# Patient Record
Sex: Female | Born: 1955 | Race: White | Hispanic: No | Marital: Married | Smoking: Former smoker
Health system: Southern US, Community
[De-identification: ages and names within clinical notes are randomized; demographics above are authoritative.]

## PROBLEM LIST (undated history)

## (undated) DIAGNOSIS — K219 Gastro-esophageal reflux disease without esophagitis: Secondary | ICD-10-CM

## (undated) DIAGNOSIS — K635 Polyp of colon: Secondary | ICD-10-CM

## (undated) DIAGNOSIS — E785 Hyperlipidemia, unspecified: Secondary | ICD-10-CM

## (undated) HISTORY — DX: Hyperlipidemia, unspecified: E78.5

## (undated) HISTORY — PX: COLONOSCOPY: SHX5424

## (undated) HISTORY — DX: Polyp of colon: K63.5

## (undated) HISTORY — PX: BREAST BIOPSY: SHX20

## (undated) HISTORY — PX: CHOLECYSTECTOMY: SHX55

## (undated) HISTORY — DX: Gastro-esophageal reflux disease without esophagitis: K21.9

---

## 1984-04-26 HISTORY — PX: ABDOMINAL HYSTERECTOMY: SHX81

## 1984-04-26 HISTORY — PX: APPENDECTOMY: SHX54

## 2008-01-30 ENCOUNTER — Ambulatory Visit: Payer: Self-pay | Admitting: Internal Medicine

## 2008-06-19 ENCOUNTER — Encounter: Admission: RE | Admit: 2008-06-19 | Discharge: 2008-06-19 | Payer: Self-pay | Admitting: Family Medicine

## 2008-11-13 ENCOUNTER — Encounter (INDEPENDENT_AMBULATORY_CARE_PROVIDER_SITE_OTHER): Payer: Self-pay | Admitting: General Surgery

## 2008-11-13 ENCOUNTER — Ambulatory Visit (HOSPITAL_COMMUNITY): Admission: RE | Admit: 2008-11-13 | Discharge: 2008-11-13 | Payer: Self-pay | Admitting: General Surgery

## 2009-11-10 ENCOUNTER — Encounter: Admission: RE | Admit: 2009-11-10 | Discharge: 2009-11-10 | Payer: Self-pay | Admitting: Family Medicine

## 2010-05-17 ENCOUNTER — Encounter: Payer: Self-pay | Admitting: Family Medicine

## 2010-07-02 ENCOUNTER — Ambulatory Visit (HOSPITAL_COMMUNITY): Payer: Self-pay

## 2010-08-02 LAB — COMPREHENSIVE METABOLIC PANEL
ALT: 18 U/L (ref 0–35)
AST: 22 U/L (ref 0–37)
Albumin: 3.9 g/dL (ref 3.5–5.2)
Alkaline Phosphatase: 51 U/L (ref 39–117)
Potassium: 3.3 mEq/L — ABNORMAL LOW (ref 3.5–5.1)
Sodium: 144 mEq/L (ref 135–145)
Total Protein: 6.5 g/dL (ref 6.0–8.3)

## 2010-08-02 LAB — DIFFERENTIAL
Basophils Relative: 1 % (ref 0–1)
Eosinophils Absolute: 0.1 10*3/uL (ref 0.0–0.7)
Monocytes Absolute: 0.5 10*3/uL (ref 0.1–1.0)
Monocytes Relative: 7 % (ref 3–12)

## 2010-08-02 LAB — CBC
Platelets: 283 10*3/uL (ref 150–400)
RDW: 13 % (ref 11.5–15.5)

## 2010-09-08 NOTE — Op Note (Signed)
Lindsey Morales, Lindsey Morales           ACCOUNT NO.:  [id number]   MEDICAL RECORD NO.:  [id number]          PATIENT TYPE:  AMB   LOCATION:  DAY                          FACILITY:  [facility]   PHYSICIAN:  Lennie Muckle, MD      DATE OF BIRTH:  06/19/1955   DATE OF PROCEDURE:  11/13/2008  DATE OF DISCHARGE:                               OPERATIVE REPORT   PREOPERATIVE DIAGNOSIS:  Biliary colic.   POSTOPERATIVE DIAGNOSIS:  Biliary colic.   PROCEDURE:  Laparoscopic cholecystectomy.   SURGEON:  Bertram Savin, M.D.  No assistants.   General endotracheal anesthesia.   FINDINGS:  Small stones in the gallbladder.  No other significant  findings.   SPECIMEN:  Gallbladder.   Minimal amount of blood loss.  No immediate complications.  No drains  were placed.   INDICATIONS FOR PROCEDURE:  Lindsey Morales was identified in the  preoperative holding area.  She was a 55 year old female who had had  epigastric right upper quadrant pain for half a year.  She had had  endoscopy which was normal.  CT did reveal a stone within the  gallbladder.  There was no other significant findings on her workup  aside from abdominal bloating and mild regurgitation.  I talked to her  about doing a laparoscopic cholecystectomy since it was found that her  symptoms were related to biliary colic.  An informed consent was  obtained prior to procedure.   DESCRIPTION OF PROCEDURE:  Lindsey Morales was identified in the  preoperative holding area.  She had no change in her examination.  She  received a g of Kefzol and was taken to the operating room.  She was  then placed under general endotracheal anesthesia.  Her abdomen was  prepped and draped in the usual sterile fashion.  I placed an incision  in the right upper quadrant and using a 5-mm trocar and the OptiView  gained entry into the abdominal cavity.  All layers of abdominal wall  were visualized upon entry.  After insufflating the abdomen, I carefully  inspected.  I found  no evidence of injury upon placement of the trocar.  I then went down towards the umbilical region.  Previous incisions had  no adhesional scars.  I then closed with an 11-mm trocar at the  epigastric region under visualization with camera.  I then placed 2  additional trocars in the right upper quadrant.  The gallbladder was  identified in normal anatomic position.  The fundus was retracted to the  head of the patient.  We protected the infundibulum away from the liver  bed.  The cystic artery was anterior to the cystic duct.  I was able to  clip and divide this in order to facilitate the dissection the cystic  duct.  Then, using electrocautery and [place] forceps, I gained  critical view of the cystic duct and the liver bed.  I could palpate no  stones near the cystic duct.  I was able to place three 5-mm clips  proximally and one distally and transected the cystic duct.  There was a  posterior branch of the  cystic artery which I also clipped and divided  without difficulty.  The remaining peritoneal attachments were divided  with electrocautery.  I placed a Ray-Tec in the abdominal cavity and  used this to catch the minimal blood.  Also used this to visualize no  evidence of a biliary leak.  The gallbladder was placed an EndoCatch bag  and removed from the umbilical port.  I then removed the Ray-Tec.  I  found no bleeding from the liver bed.  I then closed the fascial defect  at the umbilical region using a 0 Vicryl suture with the suture passer.  The fascia was closed without difficulty.  Final inspection of the  abdomen revealed no bleeding and no evidence of injury.  I released the  pneumoperitoneum, removed the trocars, closed the skin with 4-0  Monocryl.  Dermabond was placed as a final dressing.  The patient was  then extubated and transported to anesthesia care in stable condition.  She will be discharged home.  She is able to tolerate liquids and have  her pain adequately  controlled.      Lennie Muckle, MD  Electronically Signed     ALA/MEDQ  D:  11/13/2008  T:  11/13/2008  Job:  [id number]   cc:   Stormy Card, M.D.  [phone]   Anselmo Rod, M.D.  Fax: [phone]

## 2010-11-24 ENCOUNTER — Other Ambulatory Visit: Payer: Self-pay | Admitting: Family Medicine

## 2010-11-24 DIAGNOSIS — Z1231 Encounter for screening mammogram for malignant neoplasm of breast: Secondary | ICD-10-CM

## 2010-12-01 ENCOUNTER — Ambulatory Visit: Payer: Self-pay

## 2010-12-15 ENCOUNTER — Ambulatory Visit
Admission: RE | Admit: 2010-12-15 | Discharge: 2010-12-15 | Disposition: A | Discharge status: Home / Self Care | Payer: Self-pay | Source: Ambulatory Visit | Attending: Family Medicine | Admitting: Family Medicine

## 2010-12-15 DIAGNOSIS — Z1231 Encounter for screening mammogram for malignant neoplasm of breast: Secondary | ICD-10-CM

## 2011-12-02 ENCOUNTER — Other Ambulatory Visit: Payer: Self-pay | Admitting: Family Medicine

## 2011-12-02 DIAGNOSIS — Z139 Encounter for screening, unspecified: Secondary | ICD-10-CM

## 2011-12-14 ENCOUNTER — Ambulatory Visit (INDEPENDENT_AMBULATORY_CARE_PROVIDER_SITE_OTHER): Payer: Managed Care, Other (non HMO)

## 2011-12-14 DIAGNOSIS — Z1231 Encounter for screening mammogram for malignant neoplasm of breast: Secondary | ICD-10-CM

## 2011-12-14 DIAGNOSIS — Z139 Encounter for screening, unspecified: Secondary | ICD-10-CM

## 2011-12-20 ENCOUNTER — Ambulatory Visit (INDEPENDENT_AMBULATORY_CARE_PROVIDER_SITE_OTHER): Payer: Managed Care, Other (non HMO)

## 2011-12-20 ENCOUNTER — Ambulatory Visit (INDEPENDENT_AMBULATORY_CARE_PROVIDER_SITE_OTHER): Payer: Managed Care, Other (non HMO) | Admitting: Physician Assistant

## 2011-12-20 ENCOUNTER — Encounter: Payer: Self-pay | Admitting: Physician Assistant

## 2011-12-20 VITALS — BP 133/88 | HR 83 | Ht 66.0 in | Wt 177.0 lb

## 2011-12-20 DIAGNOSIS — R209 Unspecified disturbances of skin sensation: Secondary | ICD-10-CM

## 2011-12-20 DIAGNOSIS — K219 Gastro-esophageal reflux disease without esophagitis: Secondary | ICD-10-CM | POA: Insufficient documentation

## 2011-12-20 DIAGNOSIS — M549 Dorsalgia, unspecified: Secondary | ICD-10-CM

## 2011-12-20 DIAGNOSIS — M47814 Spondylosis without myelopathy or radiculopathy, thoracic region: Secondary | ICD-10-CM

## 2011-12-20 DIAGNOSIS — F419 Anxiety disorder, unspecified: Secondary | ICD-10-CM

## 2011-12-20 DIAGNOSIS — M47817 Spondylosis without myelopathy or radiculopathy, lumbosacral region: Secondary | ICD-10-CM

## 2011-12-20 DIAGNOSIS — R202 Paresthesia of skin: Secondary | ICD-10-CM

## 2011-12-20 DIAGNOSIS — R030 Elevated blood-pressure reading, without diagnosis of hypertension: Secondary | ICD-10-CM

## 2011-12-20 DIAGNOSIS — F411 Generalized anxiety disorder: Secondary | ICD-10-CM

## 2011-12-20 DIAGNOSIS — R200 Anesthesia of skin: Secondary | ICD-10-CM

## 2011-12-20 DIAGNOSIS — E785 Hyperlipidemia, unspecified: Secondary | ICD-10-CM

## 2011-12-20 DIAGNOSIS — L659 Nonscarring hair loss, unspecified: Secondary | ICD-10-CM

## 2011-12-20 NOTE — Patient Instructions (Addendum)
Recommend ASA to take with food.   DASH Diet The DASH diet stands for "Dietary Approaches to Stop Hypertension." It is a healthy eating plan that has been shown to reduce high blood pressure (hypertension) in as little as 14 days, while also possibly providing other significant health benefits. These other health benefits include reducing the risk of breast cancer after menopause and reducing the risk of type 2 diabetes, heart disease, colon cancer, and stroke. Health benefits also include weight loss and slowing kidney failure in patients with chronic kidney disease.  DIET GUIDELINES  Limit salt (sodium). Your diet should contain less than 1500 mg of sodium daily.   Limit refined or processed carbohydrates. Your diet should include mostly whole grains. Desserts and added sugars should be used sparingly.   Include small amounts of heart-healthy fats. These types of fats include nuts, oils, and tub margarine. Limit saturated and trans fats. These fats have been shown to be harmful in the body.  CHOOSING FOODS  The following food groups are based on a 2000 calorie diet. See your Registered Dietitian for individual calorie needs. Grains and Grain Products (6 to 8 servings daily)  Eat More Often: Whole-wheat bread, brown rice, whole-grain or wheat pasta, quinoa, popcorn without added fat or salt (air popped).   Eat Less Often: White bread, white pasta, white rice, cornbread.  Vegetables (4 to 5 servings daily)  Eat More Often: Fresh, frozen, and canned vegetables. Vegetables may be raw, steamed, roasted, or grilled with a minimal amount of fat.   Eat Less Often/Avoid: Creamed or fried vegetables. Vegetables in a cheese sauce.  Fruit (4 to 5 servings daily)  Eat More Often: All fresh, canned (in natural juice), or frozen fruits. Dried fruits without added sugar. One hundred percent fruit juice ( cup [237 mL] daily).   Eat Less Often: Dried fruits with added sugar. Canned fruit in light or  heavy syrup.  Foot Locker, Fish, and Poultry (2 servings or less daily. One serving is 3 to 4 oz [85-114 g]).  Eat More Often: Ninety percent or leaner ground beef, tenderloin, sirloin. Round cuts of beef, chicken breast, [place] breast. All fish. Grill, bake, or broil your meat. Nothing should be fried.   Eat Less Often/Avoid: Fatty cuts of meat, [place], or chicken leg, thigh, or wing. Fried cuts of meat or fish.  Dairy (2 to 3 servings)  Eat More Often: Low-fat or fat-free milk, low-fat plain or light yogurt, reduced-fat or part-skim cheese.   Eat Less Often/Avoid: Milk (whole, 2%, skim, or chocolate).Whole milk yogurt. Full-fat cheeses.  Nuts, Seeds, and Legumes (4 to 5 servings per week)  Eat More Often: All without added salt.   Eat Less Often/Avoid: Salted nuts and seeds, canned beans with added salt.  Fats and Sweets (limited)  Eat More Often: Vegetable oils, tub margarines without trans fats, sugar-free gelatin. Mayonnaise and salad dressings.   Eat Less Often/Avoid: Coconut oils, palm oils, butter, stick margarine, cream, half and half, cookies, candy, pie.  FOR MORE INFORMATION The Dash Diet Eating Plan: www.dashdiet.org Document Released: 04/01/2011 Document Reviewed: 03/22/2011 [employer] Patient Information 2012 [employer].1.5 Gram Low Sodium Diet A 1.5 gram sodium diet restricts the amount of sodium in the diet to no more than 1.5 g or 1500 mg daily. The American Heart Association recommends Americans over the age of 83 to consume no more than 1500 mg of sodium each day to reduce the risk of developing high blood pressure. Research also shows that limiting  sodium may reduce heart attack and stroke risk. Many foods contain sodium for flavor and sometimes as a preservative. When the amount of sodium in a diet needs to be low, it is important to know what to look for when choosing foods and drinks. The following includes some information and guidelines to help make it easier for  you to adapt to a low sodium diet. QUICK TIPS  Do not add salt to food.   Avoid convenience items and fast food.   Choose unsalted snack foods.   Buy lower sodium products, often labeled as "lower sodium" or "no salt added."   Check food labels to learn how much sodium is in 1 serving.   When eating at a restaurant, ask that your food be prepared with less salt or none, if possible.  READING FOOD LABELS FOR SODIUM INFORMATION The nutrition facts label is a good place to find how much sodium is in foods. Look for products with no more than 400 mg of sodium per serving. Remember that 1.5 g = 1500 mg. The food label may also list foods as:  Sodium-free: Less than 5 mg in a serving.   Very low sodium: 35 mg or less in a serving.   Low-sodium: 140 mg or less in a serving.   Light in sodium: 50% less sodium in a serving. For example, if a food that usually has 300 mg of sodium is changed to become light in sodium, it will have 150 mg of sodium.   Reduced sodium: 25% less sodium in a serving. For example, if a food that usually has 400 mg of sodium is changed to reduced sodium, it will have 300 mg of sodium.  CHOOSING FOODS Grains  Avoid: Salted crackers and snack items. Some cereals, including instant hot cereals. Bread stuffing and biscuit mixes. Seasoned rice or pasta mixes.   Choose: Unsalted snack items. Low-sodium cereals, oats, puffed wheat and rice, shredded wheat. English muffins and bread. Pasta.  Meats  Avoid: Salted, canned, smoked, spiced, pickled meats, including fish and poultry. Bacon, ham, sausage, cold cuts, hot dogs, anchovies.   Choose: Low-sodium canned tuna and salmon. Fresh or frozen meat, poultry, and fish.  Dairy  Avoid: Processed cheese and spreads. Cottage cheese. Buttermilk and condensed milk. Regular cheese.   Choose: Milk. Low-sodium cottage cheese. Yogurt. Sour cream. Low-sodium cheese.  Fruits and Vegetables  Avoid: Regular canned vegetables.  Regular canned tomato sauce and paste. Frozen vegetables in sauces. Olives. Rosita Fire. Relishes. Sauerkraut.   Choose: Low-sodium canned vegetables. Low-sodium tomato sauce and paste. Frozen or fresh vegetables. Fresh and frozen fruit.  Condiments  Avoid: Canned and packaged gravies. Worcestershire sauce. Tartar sauce. Barbecue sauce. Soy sauce. Steak sauce. Ketchup. Onion, garlic, and table salt. Meat flavorings and tenderizers.   Choose: Fresh and dried herbs and spices. Low-sodium varieties of mustard and ketchup. Lemon juice. Tabasco sauce. Horseradish.  SAMPLE 1.5 GRAM SODIUM MEAL PLAN Breakfast / Sodium (mg)  1 cup low-fat milk / 143 mg   1 whole-wheat English muffin / 240 mg   1 tbs heart-healthy margarine / 153 mg   1 hard-boiled egg / 139 mg   1 small orange / 0 mg  Lunch / Sodium (mg)  1 cup raw carrots / 76 mg   2 tbs no salt added peanut butter / 5 mg   2 slices whole-wheat bread / 270 mg   1 tbs jelly / 6 mg    cup red grapes / 2 mg  Dinner / Sodium (mg)  1 cup whole-wheat pasta / 2 mg   1 cup low-sodium tomato sauce / 73 mg   3 oz lean ground beef / 57 mg   1 small side salad (1 cup raw spinach leaves,  cup cucumber,  cup yellow bell pepper) with 1 tsp olive oil and 1 tsp red wine vinegar / 25 mg  Snack / Sodium (mg)  1 container low-fat vanilla yogurt / 107 mg   3 graham cracker squares / 127 mg  Nutrient Analysis  Calories: 1745   Protein: 75 g   Carbohydrate: 237 g   Fat: 57 g   Sodium: 1425 mg  Document Released: 04/12/2005 Document Revised: 04/01/2011 Document Reviewed: 07/14/2009 [facility] Patient Information 2012 [place], [place].

## 2011-12-20 NOTE — Progress Notes (Signed)
Subjective:    Patient ID: Lindsey Morales, female    DOB: [birth year], 56 y.o.   MRN: [id number]  HPI Patient presents to the clinic today to establish care as a new patient. Past medical history is positive for hyperlipidemia which he has controlled with diet and exercise thus far and GERD. She is on omeprazole 40 mg twice a week approximately to control symptoms. She has had H. pylori infection.  She is a former smoker who stopped smoking in December of 2008. She did smoke for a total of 8 years.   Her family history is positive for breast cancer with her sister hyperlipidemia, hypertension, myocardial infarction, and diabetes. She reports to have had regular complete physical.   Her last mammogram was 12/14/11 and she does not have the results back yet. She is up-to-date on her colonoscopy which was normal and in March 2011 51 her to followup in 5 years with another colonoscopy. She does regularly exercise in the form of walking 30 minutes at least 5-6 days a week.  She does report ongoing back pain in the lumbar and thoracic region that has been present for 2 months. She has never had any imaging done on her back. She does have a history of being kicked by a horse in 1991. She incurred no significant injuries from that encounter. She does not notice any recent injuries that could be affecting her back pain. She has had some numbness and tingling but only down her right side in her right hand and right leg and feet. She denies any loss of bowel or bladder function. She has occasional radiation of pain into her buttocks but does not radiate down her whole leg. She has not lost any of her daily function. Her muscles are also tender in her trunk region. She reports that she feels sore in her upper and mid back area.  Her blood pressure was elevated in triage. She reports that this is not something that she has had problems within the past. She denies any chest pains, palpitations, shortness of  breath, headaches, dizziness. She does have a family history of hypertension but has never been on pain medications herself. She denies any swelling of her lower extremity.  Patient does report that she feels more anxious lately like her insides are just fidgety. She has had problems with anxiety in the past but never had take medication. She denies any heat or cold intolerances other than her occasional hot flashes. She does so like her hair is thinning. She did not feel like her anxiety is controlling her life. She reports that she sleeps well at night. She denies any depression or feeling of hopelessness or helplessness. She has had a lot of situational things that are increasing her anxiety such as breaking pounds with a really good friend. She thinks things will be much better when she gets through this breakup.   Review of Systems     Objective:   Physical Exam  Constitutional: She is oriented to person, place, and time. She appears well-developed and well-nourished.       Overweight  HENT:  Head: Normocephalic and atraumatic.  Cardiovascular: Normal rate, regular rhythm and normal heart sounds.   Pulmonary/Chest: Effort normal and breath sounds normal.  Musculoskeletal:       Range of motion at the waist is full and normal without pain. Palpation along the lumbar spine around L2 and L3 is painful. No other pain elicited along the spine. There is paraspinous  tenderness in the upper shoulders and along the thoracic spine area. Strength of right arm 5 out of 5 along with strength of right leg is 5 out of 5. Patient walks with normal gait. Negative straight leg test of right leg. Reflex of right knee is 1+.  Neurological: She is alert and oriented to person, place, and time.  Skin: Skin is warm and dry.  Psychiatric: She has a normal mood and affect. Her behavior is normal.          Assessment & Plan:  Elevated blood pressure reading-I rechecked her blood pressure at the end of the  visit and decreased to 133/88. I did still give her counseling on a low sodium diet. I also gave her handout regarding the DASH diet. Talked about how exercise and weight loss can also help decrease blood pressure. Will recheck blood pressure at complete physical in one month. Discussed with patient how important it is to control blood pressure to keep overall body/kidney healthy.  Lower back pain/thoracic pain numbness and tingling of right leg and foot along with right arm-will get lumbar and thoracic x-rays today and call with results. Also will get a thyroid and a B12 just to make sure that there is no metabolic causes of numbness and tingling. She does have a history of diabetes did not get a fasting glucose today because patient had eaten she reports that she will come back in a month complete physical. She does not like to take a lot of pain medications therefore she has not tried Tylenol. She can not take NSAIDs do to her GERD. I did encourage her to take 500 mg of Tylenol when needed for pain. I also encouraged her to keep walking and potentially add in strength exercises to help strengthen the muscles around the bone.  Anxiety/hair loss-already ordering a TSH. I discussed with patient if inside he starts to control her life or be coming in every day think that she can call as soon as things we can do to help with anxiety. I encouraged her to exercise regularly because that naturally helps to decrease anxiety. I encouraged her to make sure she is taking a multivitamin for with the B. vitamins to help maintain good hair growth. We will call with any lab results.  With patient's extensive aspirin daily. If this begins to worsen her GERD or have any side effects she can stop the however I want to see if taken with food this affects her stomach at all. She has no history of GI bleed.  Tdap given today.   Patient was reminded that she needed CPE in next month or so. Patient was informed to come  fasting so that we could check cholesterol.

## 2011-12-22 ENCOUNTER — Telehealth: Payer: Self-pay | Admitting: Family Medicine

## 2011-12-22 NOTE — Telephone Encounter (Signed)
Pt called request to know if her X-ray she had done on Monday 12/20/11 can be filed as in office visit per her insurance company instead of outpatient. Patient states that her insurance company states she will have to pay $500.00 deductible if filed as outpatient. Thanks

## 2011-12-22 NOTE — Telephone Encounter (Signed)
It can't be billed as part of the office visit as far as I am aware because the imaging Department does her own billing. We could see if maybe Molli Hazard knows.

## 2012-01-28 ENCOUNTER — Encounter: Payer: Managed Care, Other (non HMO) | Admitting: Physician Assistant

## 2012-11-06 ENCOUNTER — Other Ambulatory Visit: Payer: Self-pay | Admitting: Family Medicine

## 2012-11-06 DIAGNOSIS — Z1231 Encounter for screening mammogram for malignant neoplasm of breast: Secondary | ICD-10-CM

## 2012-12-14 ENCOUNTER — Ambulatory Visit: Payer: Managed Care, Other (non HMO)

## 2012-12-19 ENCOUNTER — Ambulatory Visit (INDEPENDENT_AMBULATORY_CARE_PROVIDER_SITE_OTHER): Payer: Managed Care, Other (non HMO)

## 2012-12-19 DIAGNOSIS — Z1231 Encounter for screening mammogram for malignant neoplasm of breast: Secondary | ICD-10-CM

## 2012-12-21 ENCOUNTER — Ambulatory Visit: Payer: Managed Care, Other (non HMO)

## 2013-11-01 ENCOUNTER — Other Ambulatory Visit: Payer: Self-pay | Admitting: Family Medicine

## 2013-11-01 DIAGNOSIS — Z139 Encounter for screening, unspecified: Secondary | ICD-10-CM

## 2013-12-11 ENCOUNTER — Ambulatory Visit (INDEPENDENT_AMBULATORY_CARE_PROVIDER_SITE_OTHER): Payer: Managed Care, Other (non HMO)

## 2013-12-11 DIAGNOSIS — Z139 Encounter for screening, unspecified: Secondary | ICD-10-CM

## 2013-12-11 DIAGNOSIS — Z1231 Encounter for screening mammogram for malignant neoplasm of breast: Secondary | ICD-10-CM

## 2014-11-01 ENCOUNTER — Other Ambulatory Visit: Payer: Self-pay | Admitting: Family Medicine

## 2014-11-01 DIAGNOSIS — Z1231 Encounter for screening mammogram for malignant neoplasm of breast: Secondary | ICD-10-CM

## 2014-12-19 ENCOUNTER — Ambulatory Visit (INDEPENDENT_AMBULATORY_CARE_PROVIDER_SITE_OTHER): Payer: Managed Care, Other (non HMO)

## 2014-12-19 DIAGNOSIS — Z1231 Encounter for screening mammogram for malignant neoplasm of breast: Secondary | ICD-10-CM | POA: Diagnosis not present

## 2015-10-01 ENCOUNTER — Other Ambulatory Visit: Payer: Self-pay | Admitting: Family Medicine

## 2015-10-01 DIAGNOSIS — Z1231 Encounter for screening mammogram for malignant neoplasm of breast: Secondary | ICD-10-CM

## 2016-01-12 ENCOUNTER — Ambulatory Visit
Admission: RE | Admit: 2016-01-12 | Discharge: 2016-01-12 | Disposition: A | Discharge status: Home / Self Care | Payer: No Typology Code available for payment source | Source: Ambulatory Visit | Attending: Family Medicine | Admitting: Family Medicine

## 2016-01-12 DIAGNOSIS — Z1231 Encounter for screening mammogram for malignant neoplasm of breast: Secondary | ICD-10-CM

## 2016-11-09 ENCOUNTER — Other Ambulatory Visit: Payer: Self-pay | Admitting: Family Medicine

## 2016-11-09 DIAGNOSIS — Z1231 Encounter for screening mammogram for malignant neoplasm of breast: Secondary | ICD-10-CM

## 2017-01-14 ENCOUNTER — Ambulatory Visit: Payer: No Typology Code available for payment source

## 2017-01-14 ENCOUNTER — Ambulatory Visit (INDEPENDENT_AMBULATORY_CARE_PROVIDER_SITE_OTHER): Payer: Managed Care, Other (non HMO)

## 2017-01-14 DIAGNOSIS — Z1231 Encounter for screening mammogram for malignant neoplasm of breast: Secondary | ICD-10-CM | POA: Diagnosis not present

## 2017-12-06 ENCOUNTER — Other Ambulatory Visit: Payer: Self-pay | Admitting: Family Medicine

## 2017-12-06 DIAGNOSIS — Z0000 Encounter for general adult medical examination without abnormal findings: Secondary | ICD-10-CM

## 2018-01-20 ENCOUNTER — Ambulatory Visit (INDEPENDENT_AMBULATORY_CARE_PROVIDER_SITE_OTHER): Payer: 59

## 2018-01-20 DIAGNOSIS — Z1231 Encounter for screening mammogram for malignant neoplasm of breast: Secondary | ICD-10-CM

## 2018-01-20 DIAGNOSIS — Z0000 Encounter for general adult medical examination without abnormal findings: Secondary | ICD-10-CM

## 2018-12-12 ENCOUNTER — Other Ambulatory Visit: Payer: Self-pay | Admitting: Family Medicine

## 2018-12-12 DIAGNOSIS — Z1231 Encounter for screening mammogram for malignant neoplasm of breast: Secondary | ICD-10-CM

## 2019-01-25 ENCOUNTER — Other Ambulatory Visit: Payer: Self-pay

## 2019-01-25 ENCOUNTER — Ambulatory Visit (INDEPENDENT_AMBULATORY_CARE_PROVIDER_SITE_OTHER): Payer: 59

## 2019-01-25 DIAGNOSIS — Z1231 Encounter for screening mammogram for malignant neoplasm of breast: Secondary | ICD-10-CM | POA: Diagnosis not present

## 2019-03-30 ENCOUNTER — Other Ambulatory Visit: Payer: Self-pay

## 2019-03-30 DIAGNOSIS — Z20822 Contact with and (suspected) exposure to covid-19: Secondary | ICD-10-CM

## 2019-04-02 ENCOUNTER — Telehealth: Payer: Self-pay | Admitting: Physician Assistant

## 2019-04-02 LAB — NOVEL CORONAVIRUS, NAA: SARS-CoV-2, NAA: NOT DETECTED

## 2019-04-02 NOTE — Telephone Encounter (Signed)
Patient informed of negative covid result. Patient verbalized understanding.   

## 2021-05-18 ENCOUNTER — Other Ambulatory Visit: Payer: Self-pay

## 2021-05-18 ENCOUNTER — Encounter (HOSPITAL_BASED_OUTPATIENT_CLINIC_OR_DEPARTMENT_OTHER): Payer: Self-pay | Admitting: Family Medicine

## 2021-05-18 ENCOUNTER — Ambulatory Visit (INDEPENDENT_AMBULATORY_CARE_PROVIDER_SITE_OTHER): Payer: 59 | Admitting: Family Medicine

## 2021-05-18 DIAGNOSIS — K219 Gastro-esophageal reflux disease without esophagitis: Secondary | ICD-10-CM

## 2021-05-18 DIAGNOSIS — Z0000 Encounter for general adult medical examination without abnormal findings: Secondary | ICD-10-CM

## 2021-05-18 NOTE — Progress Notes (Signed)
New Patient Office Visit  Subjective:  Patient ID: Lindsey Morales, female    DOB: 1956/03/16  Age: 66 y.o. MRN: [id number]  CC:  Chief Complaint  Patient presents with   Establish Care    Former PCP - [facility] - notes in epic. Patient has no specific concerns or complaints today. She is up to date on colonoscopy and is due for her mammogram in July/August    HPI Lindsey Morales is a 66 year old female presenting to establish in clinic.  She has current concerns as outlined above.  Reports history of GERD, hyperlipidemia.  GERD: Currently manages with pantoprazole.  Reports good control of symptoms with PPI -she will only use medication a couple times a week.  Hyperlipidemia: Has had hyperlipidemia on prior labs, no specific medications utilized presently.  Has not had lipid panel checked recently.  Patient is originally from [place], [place].  Hobbies include walking, crocheting, Bible reading.  Past Medical History:  Diagnosis Date   GERD (gastroesophageal reflux disease)    Hyperlipidemia    In the past has been controlled with diet    Past Surgical History:  Procedure Laterality Date   ABDOMINAL HYSTERECTOMY  1986   BREAST BIOPSY     CHOLECYSTECTOMY      Family History  Problem Relation Age of Onset   Heart disease Mother        heart attack   Diabetes Mother    Hyperlipidemia Mother    Hypertension Mother    Cancer Sister        breast   Heart disease Sister        heart attack   Hyperlipidemia Sister    Hypertension Sister    Hyperlipidemia Brother    Hypertension Brother     Social History   Socioeconomic History   Marital status: Married    Spouse name: Not on file   Number of children: Not on file   Years of education: Not on file   Highest education level: Not on file  Occupational History   Not on file  Tobacco Use   Smoking status: Former    Types: Cigarettes    Quit date: 04/25/2010    Years since quitting: 11.0   Smokeless  tobacco: Not on file  Substance and Sexual Activity   Alcohol use: Not on file   Drug use: Not on file   Sexual activity: Not on file  Other Topics Concern   Not on file  Social History Narrative   Not on file   Social Determinants of Health   Financial Resource Strain: Not on file  Food Insecurity: Not on file  Transportation Needs: Not on file  Physical Activity: Not on file  Stress: Not on file  Social Connections: Not on file  Intimate Partner Violence: Not on file    Objective:   Today's Vitals: BP 140/82    Pulse 85    Ht 5\' 6"  (1.676 m)    Wt 179 lb (81.2 kg)    SpO2 98%    BMI 28.89 kg/m   Physical Exam  66 year old female in no acute distress Cardiovascular exam with regular rate and rhythm, no murmur appreciated Lungs clear to auscultation bilaterally  Assessment & Plan:   Problem List Items Addressed This Visit   None   Outpatient Encounter Medications as of 05/18/2021  Medication Sig   pantoprazole (PROTONIX) 40 MG tablet Take 40 mg by mouth 2 (two) times a week.   [DISCONTINUED] omeprazole (  PRILOSEC) 40 MG capsule Take 40 mg by mouth daily.   No facility-administered encounter medications on file as of 05/18/2021.    Follow-up: No follow-ups on file.  Plan for follow-up in about 4 to 6 months for CPE, will complete nurse visit for labs about 1 week prior.  Patient can return to office sooner as needed should any new issues or concerns arise  Korver Graybeal J De [place], MD

## 2021-05-18 NOTE — Patient Instructions (Signed)
°  Medication Instructions:  Your physician recommends that you continue on your current medications as directed. Please refer to the Current Medication list given to you today. --If you need a refill on any your medications before your next appointment, please call your pharmacy first. If no refills are authorized on file call the office.--  Follow-Up: Your next appointment:   Your physician recommends that you schedule a follow-up appointment in: June for a CPE with Dr. de [place]  You will receive a text message or e-mail with a link to a survey about your care and experience with [place] today! We would greatly appreciate your feedback!   Thanks for letting us be apart of your health journey!!  Primary Care and Sports Medicine   Dr. Arlina Robes [place]   We encourage you to activate your patient portal called "MyChart".  Sign up information is provided on this After Visit Summary.  MyChart is used to connect with patients for Virtual Visits (Telemedicine).  Patients are able to view lab/test results, encounter notes, upcoming appointments, etc.  Non-urgent messages can be sent to your provider as well. To learn more about what you can do with MyChart, please visit --  [email or web address].

## 2021-06-25 ENCOUNTER — Telehealth (HOSPITAL_BASED_OUTPATIENT_CLINIC_OR_DEPARTMENT_OTHER): Payer: Self-pay | Admitting: Family Medicine

## 2021-06-25 NOTE — Telephone Encounter (Signed)
Pt called regarding her appt with our office on 05/18/21 stating her insurance has not received a bill form our office for this visit. Looked in pts Transaction inquiry and last visit for her was done in 2020. ?Please advise. ?

## 2021-06-26 NOTE — Telephone Encounter (Signed)
Visit encounter is not signed by provider. I have reached out to the provider to resolve this concern. ?

## 2021-06-26 NOTE — Assessment & Plan Note (Signed)
Symptoms presently controlled with intermittent use of pantoprazole ?Can continue with current regimen, monitor for any progressive symptoms ?

## 2021-08-13 ENCOUNTER — Ambulatory Visit (HOSPITAL_BASED_OUTPATIENT_CLINIC_OR_DEPARTMENT_OTHER): Payer: 59 | Admitting: Family Medicine

## 2021-08-26 ENCOUNTER — Encounter (HOSPITAL_BASED_OUTPATIENT_CLINIC_OR_DEPARTMENT_OTHER): Payer: Self-pay | Admitting: Family Medicine

## 2021-08-26 ENCOUNTER — Ambulatory Visit (INDEPENDENT_AMBULATORY_CARE_PROVIDER_SITE_OTHER): Payer: 59 | Admitting: Family Medicine

## 2021-08-26 ENCOUNTER — Ambulatory Visit (INDEPENDENT_AMBULATORY_CARE_PROVIDER_SITE_OTHER): Payer: 59

## 2021-08-26 DIAGNOSIS — M545 Low back pain, unspecified: Secondary | ICD-10-CM | POA: Diagnosis not present

## 2021-08-26 DIAGNOSIS — M79644 Pain in right finger(s): Secondary | ICD-10-CM

## 2021-08-26 MED ORDER — KETOROLAC TROMETHAMINE 60 MG/2ML IM SOLN
60.0000 mg | Freq: Once | INTRAMUSCULAR | Status: AC
  Administered 2021-08-26: 60 mg via INTRAMUSCULAR

## 2021-08-26 MED ORDER — CYCLOBENZAPRINE HCL 5 MG PO TABS: 5.0000 mg | ORAL_TABLET | Freq: Three times a day (TID) | ORAL | 1 refills | Status: DC | PRN

## 2021-08-26 NOTE — Progress Notes (Signed)
? ? ?  Procedures performed today:   ? ?None. ? ?Independent interpretation of notes and tests performed by another provider:  ? ?None. ? ?Brief History, Exam, Impression, and Recommendations:   ? ?BP (!) 145/93   Pulse 91   Ht 5\' 6"  (1.676 m)   Wt 174 lb 1.6 oz (79 kg)   SpO2 98%   BMI 28.10 kg/m?  ? ?Low back pain ?Patient reports that she has been having symptoms for a few days now.  Pain is primarily on low back, left side.  Does not have any radiation of left-sided low back pain.  She has tried OTC medications including ibuprofen which did provide some relief.  She also had some cyclobenzaprine at home which was her husband's from at least 2 years ago, did not note any significant improvement in this, however she is not sure if medication is expired.  She has not had any numbness or tingling into her left leg.  Denies any balance or gait issues.  No bowel or bladder incontinence. ?Lumbar spine: ?Visual inspection without obvious abnormality ?No tenderness to palpation over spinous processes, mild tenderness palpation through paraspinal musculature along left side of lumbar spine into proximal aspect of glutes. ?Reduced forward flexion, mild pain elicited.  Reduced back extension, pain elicited. 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 ?Recommend proceeding with conservative measures including OTC medications, ice, heat, physical therapy ?We will proceed with Toradol injection shot today, cautioned against use of NSAIDs until at least tomorrow.  Can utilize Tylenol to assist with any breakthrough pain this evening ?We will also get lumbar spine x-rays completed, did have imaging about 10 years ago which showed some mild degenerative changes ?Discussed physical therapy, patient would prefer to hold off on this at present ?She would like new prescription for cyclobenzaprine  as the 1 she has at home may be expired, cautioned on side effects, will allow for short course of cyclobenzaprine to be utilized ? ?Pain of right thumb ?Pain has been present for about 3 weeks.  Primarily noted over base of right thumb and area of CMC joint.  Pain is mostly over palmar aspect of joint, less so over radial aspect.  Pain worse when trying to open jars, whenever she has direct pressure over the joint ?On exam, there is tenderness to palpation over [facility] joint, less so along the first dorsal compartment.  Positive CMC grind test, negative Finkelstein test. 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 ? ?Plan for follow-up as needed, patient does have appointment scheduled next month for CPE ? ? ?___________________________________________ ?Kasen Sako de [place], MD, ABFM, CAQSM ?Primary Care and Sports Medicine ?[place] ?

## 2021-08-26 NOTE — Assessment & Plan Note (Signed)
Pain has been present for about 3 weeks.  Primarily noted over base of right thumb and area of CMC joint.  Pain is mostly over palmar aspect of joint, less so over radial aspect.  Pain worse when trying to open jars, whenever she has direct pressure over the joint ?On exam, there is tenderness to palpation over [facility] joint, less so along the first dorsal compartment.  Positive CMC grind test, negative Finkelstein test. 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 ?

## 2021-08-26 NOTE — Assessment & Plan Note (Signed)
Patient reports that she has been having symptoms for a few days now.  Pain is primarily on low back, left side.  Does not have any radiation of left-sided low back pain.  She has tried OTC medications including ibuprofen which did provide some relief.  She also had some cyclobenzaprine at home which was her husband's from at least 2 years ago, did not note any significant improvement in this, however she is not sure if medication is expired.  She has not had any numbness or tingling into her left leg.  Denies any balance or gait issues.  No bowel or bladder incontinence. ?Lumbar spine: ?Visual inspection without obvious abnormality ?No tenderness to palpation over spinous processes, mild tenderness palpation through paraspinal musculature along left side of lumbar spine into proximal aspect of glutes. ?Reduced forward flexion, mild pain elicited.  Reduced back extension, pain elicited. 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 ?Recommend proceeding with conservative measures including OTC medications, ice, heat, physical therapy ?We will proceed with Toradol injection shot today, cautioned against use of NSAIDs until at least tomorrow.  Can utilize Tylenol to assist with any breakthrough pain this evening ?We will also get lumbar spine x-rays completed, did have imaging about 10 years ago which showed some mild degenerative changes ?Discussed physical therapy, patient would prefer to hold off on this at present ?She would like new prescription for cyclobenzaprine as the 1 she has at home may be expired, cautioned on side effects, will allow for short course of cyclobenzaprine to be utilized ?

## 2021-08-26 NOTE — Patient Instructions (Signed)
?  Medication Instructions:  ?Your physician recommends that you continue on your current medications as directed. Please refer to the Current Medication list given to you today. ?--If you need a refill on any your medications before your next appointment, please call your pharmacy first. If no refills are authorized on file call the office.-- ?Lab Work: ?Your physician has recommended that you have lab work today: NO ?If you have labs (blood work) drawn today and your tests are completely normal, you will receive your results via [place] a phone call from our staff.  ?Please ensure you check your voicemail in the event that you authorized detailed messages to be left on a delegated number. If you have any lab test that is abnormal or we need to change your treatment, we will call you to review the results. ? ?Referrals/Procedures/Imaging: ?X-RAY downstairs 2nd floor ? ?Follow-Up: ?Your next appointment:   ?Your physician recommends that you schedule a follow-up appointment prn (as needed)  with Dr. de [place] ? ?You will receive a text message or e-mail with a link to a survey about your care and experience with [place] today! We would greatly appreciate your feedback!  ? ?Thanks for letting us be apart of your health journey!!  ?Primary Care and Sports Medicine  ? ?Dr. Kyung Rudd [place]  ? ?We encourage you to activate your patient portal called "MyChart".  Sign up information is provided on this After Visit Summary.  MyChart is used to connect with patients for Virtual Visits (Telemedicine).  Patients are able to view lab/test results, encounter notes, upcoming appointments, etc.  Non-urgent messages can be sent to your provider as well. To learn more about what you can do with MyChart, please visit --  [email or web address].    ?

## 2021-08-31 ENCOUNTER — Telehealth (HOSPITAL_BASED_OUTPATIENT_CLINIC_OR_DEPARTMENT_OTHER): Payer: Self-pay | Admitting: Family Medicine

## 2021-08-31 NOTE — Telephone Encounter (Signed)
Pt called and was wondering her results from her x-ray. Looks like it has been released waiting for provider to go over let pt know we will reach out after provider has looked over them. ?Please advise. ?

## 2021-10-12 ENCOUNTER — Ambulatory Visit (HOSPITAL_BASED_OUTPATIENT_CLINIC_OR_DEPARTMENT_OTHER): Payer: 59

## 2021-10-19 ENCOUNTER — Encounter (HOSPITAL_BASED_OUTPATIENT_CLINIC_OR_DEPARTMENT_OTHER): Payer: 59 | Admitting: Family Medicine

## 2021-11-16 ENCOUNTER — Ambulatory Visit (HOSPITAL_BASED_OUTPATIENT_CLINIC_OR_DEPARTMENT_OTHER): Payer: 59

## 2021-11-27 ENCOUNTER — Encounter (HOSPITAL_BASED_OUTPATIENT_CLINIC_OR_DEPARTMENT_OTHER): Payer: 59 | Admitting: Family Medicine

## 2021-12-23 ENCOUNTER — Ambulatory Visit (INDEPENDENT_AMBULATORY_CARE_PROVIDER_SITE_OTHER): Payer: 59 | Admitting: Family Medicine

## 2021-12-23 DIAGNOSIS — J069 Acute upper respiratory infection, unspecified: Secondary | ICD-10-CM

## 2021-12-30 ENCOUNTER — Other Ambulatory Visit (HOSPITAL_BASED_OUTPATIENT_CLINIC_OR_DEPARTMENT_OTHER): Payer: Self-pay | Admitting: Family Medicine

## 2021-12-30 DIAGNOSIS — Z1231 Encounter for screening mammogram for malignant neoplasm of breast: Secondary | ICD-10-CM

## 2022-01-08 ENCOUNTER — Ambulatory Visit (HOSPITAL_BASED_OUTPATIENT_CLINIC_OR_DEPARTMENT_OTHER): Payer: 59 | Admitting: Radiology

## 2022-01-08 NOTE — Progress Notes (Signed)
Patient ended up canceling the appointment and was not seen.

## 2022-01-19 ENCOUNTER — Ambulatory Visit (INDEPENDENT_AMBULATORY_CARE_PROVIDER_SITE_OTHER): Payer: 59 | Admitting: Family Medicine

## 2022-01-19 ENCOUNTER — Encounter (HOSPITAL_BASED_OUTPATIENT_CLINIC_OR_DEPARTMENT_OTHER): Payer: Self-pay | Admitting: Family Medicine

## 2022-01-19 DIAGNOSIS — R002 Palpitations: Secondary | ICD-10-CM | POA: Diagnosis not present

## 2022-01-19 NOTE — Patient Instructions (Signed)
  Medication Instructions:  Your physician recommends that you continue on your current medications as directed. Please refer to the Current Medication list given to you today. --If you need a refill on any your medications before your next appointment, please call your pharmacy first. If no refills are authorized on file call the office.-- Lab Work: Your physician has recommended that you have lab work today: No If you have labs (blood work) drawn today and your tests are completely normal, you will receive your results via [place] a phone call from our staff.  Please ensure you check your voicemail in the event that you authorized detailed messages to be left on a delegated number. If you have any lab test that is abnormal or we need to change your treatment, we will call you to review the results.  Referrals/Procedures/Imaging: Yes  Follow-Up: Your next appointment:   Your physician recommends that you schedule a follow-up appointment in: 4 weeks cpe with Dr. de [place].  You will receive a text message or e-mail with a link to a survey about your care and experience with [place] today! We would greatly appreciate your feedback!   Thanks for letting us be apart of your health journey!!  Primary Care and Sports Medicine   Dr. Arlina Robes [place]   We encourage you to activate your patient portal called "MyChart".  Sign up information is provided on this After Visit Summary.  MyChart is used to connect with patients for Virtual Visits (Telemedicine).  Patients are able to view lab/test results, encounter notes, upcoming appointments, etc.  Non-urgent messages can be sent to your provider as well. To learn more about what you can do with MyChart, please visit --  [email or web address].

## 2022-01-19 NOTE — Progress Notes (Signed)
    Procedures performed today:    None.  Independent interpretation of notes and tests performed by another provider:   None.  Brief History, Exam, Impression, and Recommendations:    BP 130/86   Pulse 85   Ht 5\' 6"  (1.676 m)   Wt 176 lb (79.8 kg)   SpO2 99%   BMI 28.41 kg/m   Palpitations For about 2 months now, she has noticed her heart fluttering. No associated lightheadedness, dizziness or shortness of breath.  Denies any prior similar symptoms. Also reports "an electrical shock" around her chest when fluttering is ending, this only occurs sometimes with this, no pain with this. Tends to associate with caffeine consumption, but not every time with drinking coffee. No symptoms right now. On exam, patient is in no acute distress, vital signs stable.  Cardiovascular exam with regular rate and rhythm, no murmur appreciated. Discussed recommendations for obtaining EKG today, patient declines at this time Discussed recommended laboratory evaluation, she is due to have CPE completed with baseline labs as part of evaluation, we will arrange for this to be done in the next few weeks Discussed consideration for further evaluation with cardiology, role of potential Holter monitor and reasoning for doing so.  Discussed primary considerations in relation to potential abnormal heart rhythms such as atrial fibrillation.  She is amenable to referral, referral placed today  Return in about 4 weeks (around 02/16/2022) for CPE with FBW a few days prior.   ___________________________________________ Reniah Cottingham [place], MD, ABFM, CAQSM Primary Care and [place]

## 2022-01-19 NOTE — Assessment & Plan Note (Signed)
For about 2 months now, she has noticed her heart fluttering. No associated lightheadedness, dizziness or shortness of breath.  Denies any prior similar symptoms. Also reports "an electrical shock" around her chest when fluttering is ending, this only occurs sometimes with this, no pain with this. Tends to associate with caffeine consumption, but not every time with drinking coffee. No symptoms right now. On exam, patient is in no acute distress, vital signs stable.  Cardiovascular exam with regular rate and rhythm, no murmur appreciated. Discussed recommendations for obtaining EKG today, patient declines at this time Discussed recommended laboratory evaluation, she is due to have CPE completed with baseline labs as part of evaluation, we will arrange for this to be done in the next few weeks Discussed consideration for further evaluation with cardiology, role of potential Holter monitor and reasoning for doing so.  Discussed primary considerations in relation to potential abnormal heart rhythms such as atrial fibrillation.  She is amenable to referral, referral placed today

## 2022-01-29 ENCOUNTER — Ambulatory Visit (INDEPENDENT_AMBULATORY_CARE_PROVIDER_SITE_OTHER): Payer: 59 | Admitting: Family Medicine

## 2022-01-29 ENCOUNTER — Encounter (HOSPITAL_BASED_OUTPATIENT_CLINIC_OR_DEPARTMENT_OTHER): Payer: Self-pay | Admitting: Family Medicine

## 2022-01-29 ENCOUNTER — Ambulatory Visit (HOSPITAL_BASED_OUTPATIENT_CLINIC_OR_DEPARTMENT_OTHER)
Admission: RE | Admit: 2022-01-29 | Discharge: 2022-01-29 | Disposition: A | Discharge status: Home / Self Care | Payer: 59 | Source: Ambulatory Visit | Attending: Family Medicine | Admitting: Family Medicine

## 2022-01-29 VITALS — BP 153/82 | HR 81 | Ht 66.0 in | Wt 176.0 lb

## 2022-01-29 DIAGNOSIS — R8281 Pyuria: Secondary | ICD-10-CM

## 2022-01-29 DIAGNOSIS — Z1231 Encounter for screening mammogram for malignant neoplasm of breast: Secondary | ICD-10-CM | POA: Insufficient documentation

## 2022-01-29 DIAGNOSIS — N939 Abnormal uterine and vaginal bleeding, unspecified: Secondary | ICD-10-CM

## 2022-01-29 DIAGNOSIS — R319 Hematuria, unspecified: Secondary | ICD-10-CM

## 2022-01-29 DIAGNOSIS — N3000 Acute cystitis without hematuria: Secondary | ICD-10-CM

## 2022-01-29 LAB — POCT URINALYSIS DIP (CLINITEK)
Bilirubin, UA: NEGATIVE
Glucose, UA: NEGATIVE mg/dL
Ketones, POC UA: NEGATIVE mg/dL
Nitrite, UA: NEGATIVE
POC PROTEIN,UA: NEGATIVE
Spec Grav, UA: 1.02 (ref 1.010–1.025)
Urobilinogen, UA: 0.2 E.U./dL
pH, UA: 6 (ref 5.0–8.0)

## 2022-01-29 NOTE — Progress Notes (Signed)
    Procedures performed today:    None.  Independent interpretation of notes and tests performed by another provider:   None.  Brief History, Exam, Impression, and Recommendations:    BP (!) 153/82   Pulse 81   Ht 5\' 6"  (1.676 m)   Wt 176 lb (79.8 kg)   SpO2 99%   BMI 28.41 kg/m   Pyuria Patient reports that over the weekend she was traveling with a group and indicates that she developed some vaginal bleeding.  She was concerned about this as she reports history of hysterectomy.  She is also had some mild abdominal discomfort.  Has not had any specific dysuria.  Denies any blood in her urine.  She has not had any other urinary changes such as change in appearance, odor.  Denies any fever, chills, sweats.  She does have chronic low back pain, has not had any change in this recently and pain is in lower back. On exam, patient is in no acute distress, vital signs stable, afebrile.  Abdomen with normal bowel sounds, soft, nontender, no suprapubic tenderness.  No CVA tenderness. We did complete urinalysis today which showed evidence of small amount of blood, positive leukocytes, negative nitrates. Given mixed results of urinalysis and symptoms which are not overtly indicating urinary tract infection, we will hold off on antibiotics for now and we will send urine for culture.  We will also additionally have microscopic analysis of urine completed in order to further investigate hematuria.  She does have a history of smoking and further evaluation may be needed  Hematuria As above, hematuria seen on urinalysis in the office.  We will have microscopic analysis completed, likely would need to proceed with further evaluation including referral to urology, potential imaging with abdominopelvic CT scan.  Vaginal bleeding Uncertain etiology, however concerning given patient reports having had hysterectomy in the past.  She is no longer having any vaginal bleeding at this time.  She reports that she  tried to schedule appointment with Dr. Ammie Ferrier office, however that he had a very long wait for new patient visit.  We will send referral over for her to see if we can assist with obtaining sooner appointment, referral placed today  Return in about 4 weeks (around 02/26/2022) for CPE with FBW a few days prior.   ___________________________________________ Buford Gayler [place], MD, ABFM, CAQSM Primary Care and [place]

## 2022-01-29 NOTE — Assessment & Plan Note (Signed)
Patient reports that over the weekend she was traveling with a group and indicates that she developed some vaginal bleeding.  She was concerned about this as she reports history of hysterectomy.  She is also had some mild abdominal discomfort.  Has not had any specific dysuria.  Denies any blood in her urine.  She has not had any other urinary changes such as change in appearance, odor.  Denies any fever, chills, sweats.  She does have chronic low back pain, has not had any change in this recently and pain is in lower back. On exam, patient is in no acute distress, vital signs stable, afebrile.  Abdomen with normal bowel sounds, soft, nontender, no suprapubic tenderness.  No CVA tenderness. We did complete urinalysis today which showed evidence of small amount of blood, positive leukocytes, negative nitrates. Given mixed results of urinalysis and symptoms which are not overtly indicating urinary tract infection, we will hold off on antibiotics for now and we will send urine for culture.  We will also additionally have microscopic analysis of urine completed in order to further investigate hematuria.  She does have a history of smoking and further evaluation may be needed

## 2022-01-29 NOTE — Assessment & Plan Note (Signed)
Uncertain etiology, however concerning given patient reports having had hysterectomy in the past.  She is no longer having any vaginal bleeding at this time.  She reports that she tried to schedule appointment with Dr. Ammie Ferrier office, however that he had a very long wait for new patient visit.  We will send referral over for her to see if we can assist with obtaining sooner appointment, referral placed today

## 2022-01-29 NOTE — Assessment & Plan Note (Signed)
As above, hematuria seen on urinalysis in the office.  We will have microscopic analysis completed, likely would need to proceed with further evaluation including referral to urology, potential imaging with abdominopelvic CT scan.

## 2022-01-29 NOTE — Patient Instructions (Signed)
  Medication Instructions:  Your physician recommends that you continue on your current medications as directed. Please refer to the Current Medication list given to you today. --If you need a refill on any your medications before your next appointment, please call your pharmacy first. If no refills are authorized on file call the office.-- Lab Work: Your physician has recommended that you have lab work today: Yes If you have labs (blood work) drawn today and your tests are completely normal, you will receive your results via [place] a phone call from our staff.  Please ensure you check your voicemail in the event that you authorized detailed messages to be left on a delegated number. If you have any lab test that is abnormal or we need to change your treatment, we will call you to review the results.  Referrals/Procedures/Imaging: Yes, OBGYN  Follow-Up: Your next appointment:   Your physician recommends that you schedule a follow-up appointment in: 4 weeks cpe with Dr. de [place]. Nurse visit 1 week before for labs.  You will receive a text message or e-mail with a link to a survey about your care and experience with [place] today! We would greatly appreciate your feedback!   Thanks for letting us be apart of your health journey!!  Primary Care and Sports Medicine   Dr. Arlina Robes [place]   We encourage you to activate your patient portal called "MyChart".  Sign up information is provided on this After Visit Summary.  MyChart is used to connect with patients for Virtual Visits (Telemedicine).  Patients are able to view lab/test results, encounter notes, upcoming appointments, etc.  Non-urgent messages can be sent to your provider as well. To learn more about what you can do with MyChart, please visit --  [email or web address].

## 2022-01-30 LAB — URINALYSIS, MICROSCOPIC ONLY
Casts: NONE SEEN /lpf
Epithelial Cells (non renal): NONE SEEN /hpf (ref 0–10)
WBC, UA: >30 /hpf — AB (ref 0–5)

## 2022-02-01 ENCOUNTER — Telehealth (HOSPITAL_BASED_OUTPATIENT_CLINIC_OR_DEPARTMENT_OTHER): Payer: Self-pay

## 2022-02-01 ENCOUNTER — Other Ambulatory Visit (HOSPITAL_BASED_OUTPATIENT_CLINIC_OR_DEPARTMENT_OTHER): Payer: Self-pay

## 2022-02-01 NOTE — Telephone Encounter (Signed)
Pt is very concerned about her uti. She called and stated she is now having a burning sensation and back pain and she would like to see if something could be sent in for it.  Thanks

## 2022-02-02 MED ORDER — NITROFURANTOIN MONOHYD MACRO 100 MG PO CAPS: 100.0000 mg | ORAL_CAPSULE | Freq: Two times a day (BID) | ORAL | 0 refills | Status: AC

## 2022-02-12 ENCOUNTER — Encounter (HOSPITAL_BASED_OUTPATIENT_CLINIC_OR_DEPARTMENT_OTHER): Payer: Self-pay | Admitting: Obstetrics & Gynecology

## 2022-02-12 ENCOUNTER — Ambulatory Visit (INDEPENDENT_AMBULATORY_CARE_PROVIDER_SITE_OTHER): Payer: 59 | Admitting: Obstetrics & Gynecology

## 2022-02-12 ENCOUNTER — Other Ambulatory Visit (HOSPITAL_COMMUNITY)
Admission: RE | Admit: 2022-02-12 | Discharge: 2022-02-12 | Disposition: A | Discharge status: Home / Self Care | Payer: 59 | Source: Ambulatory Visit | Attending: Obstetrics & Gynecology | Admitting: Obstetrics & Gynecology

## 2022-02-12 VITALS — BP 149/80 | HR 78 | Ht 66.0 in | Wt 178.4 lb

## 2022-02-12 DIAGNOSIS — N939 Abnormal uterine and vaginal bleeding, unspecified: Secondary | ICD-10-CM

## 2022-02-12 DIAGNOSIS — Z124 Encounter for screening for malignant neoplasm of cervix: Secondary | ICD-10-CM | POA: Insufficient documentation

## 2022-02-12 DIAGNOSIS — N3090 Cystitis, unspecified without hematuria: Secondary | ICD-10-CM

## 2022-02-12 DIAGNOSIS — N950 Postmenopausal bleeding: Secondary | ICD-10-CM

## 2022-02-12 DIAGNOSIS — Z90710 Acquired absence of both cervix and uterus: Secondary | ICD-10-CM | POA: Diagnosis not present

## 2022-02-12 NOTE — Progress Notes (Signed)
66 y.o. G2P2 Married White or Caucasian female here for evaluation of PMP bleeding.  Reports this was about two weeks.  She bleeding has stopped.  On 01/29/2022, she was seen for bladder and pelvic pressure and low back pain.  Had urinalysis showing bacteria, white cells, and red blood.  She was treated with treated with macrobid for 5 days and symptoms are completely resolved.  Hysterectomy done in her 20's due to bleeding.  Not on HRT.  Took for a short while but that was in her 40's.  Didn't tolerate this well.    Pt was referred from Dr. [place]  No LMP recorded. Patient has had a hysterectomy.          Sexually active: No.  The current method of family planning is post menopausal status.    Smoker:  no  Health Maintenance: Pap:  not recently History of abnormal Pap:  no MMG:  01/2022 Colonoscopy:  2022 Dr. Loreta Ave   reports that she quit smoking about 11 years ago. Her smoking use included cigarettes. She does not have any smokeless tobacco history on file.  Past Medical History:  Diagnosis Date   GERD (gastroesophageal reflux disease)    Hyperlipidemia    In the past has been controlled with diet    Past Surgical History:  Procedure Laterality Date   ABDOMINAL HYSTERECTOMY  1986   ovaries remain, done for bleeding   APPENDECTOMY  1986   done same day as hysterectomy   CHOLECYSTECTOMY      Current Outpatient Medications  Medication Sig Dispense Refill   pantoprazole (PROTONIX) 40 MG tablet Take 40 mg by mouth 2 (two) times a week.     No current facility-administered medications for this visit.    Family History  Problem Relation Age of Onset   Heart disease Mother        heart attack   Diabetes Mother    Hyperlipidemia Mother    Hypertension Mother    Cancer Sister        breast   Heart disease Sister        heart attack   Hyperlipidemia Sister    Hypertension Sister    Hyperlipidemia Brother    Hypertension Brother     ROS: Constitutional:  negative Genitourinary: history of bleeding  Exam:   BP (!) 149/80 (BP Location: Left Arm, Patient Position: Sitting, Cuff Size: Large)   Pulse 78   Ht 5\' 6"  (1.676 m)   Wt 178 lb 6.4 oz (80.9 kg)   BMI 28.79 kg/m   Height: 5\' 6"  (167.6 cm)  General appearance: alert, cooperative and appears stated age Head: Normocephalic, without obvious abnormality, atraumatic Breasts:  no masses, skin changes, LAD, or nipple discharge Abdomen: soft, non-tender; bowel sounds normal; no masses,  no organomegaly Extremities: extremities normal, atraumatic, no cyanosis or edema Skin: Skin color, texture, turgor normal. No rashes or lesions Lymph nodes: Cervical, supraclavicular, and axillary nodes normal. No abnormal inguinal nodes palpated Neurologic: Grossly normal   Pelvic: External genitalia: small hemangioma on right labia majora, externally              Urethra:  normal appearing urethra with no masses, tenderness or lesions              Bartholins and Skenes: normal                 Vagina: normal appearing vagina with normal color and no discharge, no lesions, rectocele present  Cervix: absent              Pap taken: Yes.   Bimanual Exam:  Uterus:  uterus absent              Adnexa: no mass, fullness, tenderness               Rectovaginal: Confirms               Anus:  normal sphincter tone, no lesions, hemorrhoids presents  Chaperone, Octaviano Batty, CMA, was present for exam.  Assessment/Plan: 1. Postmenopausal bleeding - exam is normal today except for presence of small hemangioma - pap smear is obtained - Cytology - PAP( [place])  2. Cervical cancer screening  3. H/O: hysterectomy  4.  Recent cystitis with resolution of symptoms - do not feel she needs repeat urine culture testing at this time

## 2022-02-17 ENCOUNTER — Encounter (HOSPITAL_BASED_OUTPATIENT_CLINIC_OR_DEPARTMENT_OTHER): Payer: Self-pay | Admitting: Family Medicine

## 2022-02-17 ENCOUNTER — Ambulatory Visit (INDEPENDENT_AMBULATORY_CARE_PROVIDER_SITE_OTHER): Payer: 59 | Admitting: Family Medicine

## 2022-02-17 DIAGNOSIS — R059 Cough, unspecified: Secondary | ICD-10-CM | POA: Insufficient documentation

## 2022-02-17 DIAGNOSIS — R051 Acute cough: Secondary | ICD-10-CM

## 2022-02-17 LAB — CYTOLOGY - PAP: Diagnosis: NEGATIVE

## 2022-02-17 MED ORDER — BENZONATATE 200 MG PO CAPS: 200.0000 mg | ORAL_CAPSULE | Freq: Three times a day (TID) | ORAL | 0 refills | Status: DC | PRN

## 2022-02-17 NOTE — Assessment & Plan Note (Signed)
Patient reports that symptoms started about 4 to 5 days ago and she initially developed cough.  Cough has been productive occasionally of yellow/green sputum.  She has not had any fever, chills, sweats, body aches.  She thinks she may have been around someone who had similar symptoms shortly before she developed symptoms.  With symptoms for started, she did have mild shortness of breath, denies having any continued shortness of breath at this time.  She has been using a cough syrup that she has had at home that was prescribed in the past as well as some OTC medications. During exam During appointment, patient was able to speak in complete sentences, no audible dyspnea, no coughing during encounter Given symptoms and duration, suspect acute viral illness as likely etiology.  Discussed this with patient.  Discussed that in some cases acute viral illness may transition into bacterial illness if symptoms do persist. For now, would manage symptomatically with OTC medications and we will also send in prescription for Tessalon Perles to help with controlling cough, particularly at night to allow her to get adequate rest Did discuss that cough from a viral illness can linger for up to 4 to 6 weeks after resolution of illness Recommend continue with monitoring symptoms, would expect gradual improvement over the coming days/week, if symptoms persist or are worsening, recommend further evaluation in the office for consideration of possible superimposed bacterial infection Discussed precautions related to worsening of illness, development of shortness of breath and recommendation for evaluation in ER should this occur

## 2022-02-17 NOTE — Progress Notes (Signed)
   Virtual Visit via Telephone   I connected with  Lindsey Morales  on 02/17/22 by telephone/telehealth and verified that I am speaking with the correct person using two identifiers.  I discussed the limitations, risks, security and privacy concerns of performing an evaluation and management service by telephone, including the higher likelihood of inaccurate diagnosis and treatment, and the availability of in person appointments.  We also discussed the likely need of an additional face to face encounter for complete and high quality delivery of care.  I also discussed with the patient that there may be a patient responsible charge related to this service. The patient expressed understanding and wishes to proceed.  Provider location is in medical facility. Patient location is at their home, different from provider location. People involved in care of the patient during this telehealth encounter were myself, my nurse/medical assistant, and my front office/scheduling team member.  Review of Systems: No fevers, chills, night sweats, weight loss, chest pain, or shortness of breath.  Objective Findings:    General: Speaking full sentences, no audible heavy breathing.  Sounds alert and appropriately interactive.  Independent interpretation of tests performed by another provider:   None.  Brief History, Exam, Impression, and Recommendations:    Cough Patient reports that symptoms started about 4 to 5 days ago and she initially developed cough.  Cough has been productive occasionally of yellow/green sputum.  She has not had any fever, chills, sweats, body aches.  She thinks she may have been around someone who had similar symptoms shortly before she developed symptoms.  With symptoms for started, she did have mild shortness of breath, denies having any continued shortness of breath at this time.  She has been using a cough syrup that she has had at home that was prescribed in the past as well as  some OTC medications. During exam During appointment, patient was able to speak in complete sentences, no audible dyspnea, no coughing during encounter Given symptoms and duration, suspect acute viral illness as likely etiology.  Discussed this with patient.  Discussed that in some cases acute viral illness may transition into bacterial illness if symptoms do persist. For now, would manage symptomatically with OTC medications and we will also send in prescription for Tessalon Perles to help with controlling cough, particularly at night to allow her to get adequate rest Did discuss that cough from a viral illness can linger for up to 4 to 6 weeks after resolution of illness Recommend continue with monitoring symptoms, would expect gradual improvement over the coming days/week, if symptoms persist or are worsening, recommend further evaluation in the office for consideration of possible superimposed bacterial infection Discussed precautions related to worsening of illness, development of shortness of breath and recommendation for evaluation in ER should this occur  I discussed the above assessment and treatment plan with the patient. The patient was provided an opportunity to ask questions and all were answered. The patient agreed with the plan and demonstrated an understanding of the instructions.  The patient was advised to call back or seek an in-person evaluation if the symptoms worsen or if the condition fails to improve as anticipated.  I provided 9 minutes of face to face and non-face-to-face time during this encounter date, time was needed to gather information, review chart, records, communicate/coordinate with staff remotely, as well as complete documentation.   ___________________________________________ Beryle Bagsby de [place], MD, ABFM, CA[facility] [place]

## 2022-02-18 ENCOUNTER — Telehealth (HOSPITAL_BASED_OUTPATIENT_CLINIC_OR_DEPARTMENT_OTHER): Payer: Self-pay

## 2022-02-18 ENCOUNTER — Ambulatory Visit (HOSPITAL_BASED_OUTPATIENT_CLINIC_OR_DEPARTMENT_OTHER): Payer: 59 | Admitting: Cardiology

## 2022-02-18 MED ORDER — AMOXICILLIN 875 MG PO TABS: 875.0000 mg | ORAL_TABLET | Freq: Two times a day (BID) | ORAL | 0 refills | Status: AC

## 2022-02-18 NOTE — Addendum Note (Signed)
Addended by: DE [place], Kyung Rudd J on: 02/18/2022 11:04 AM   Modules accepted: Orders

## 2022-02-18 NOTE — Telephone Encounter (Signed)
Pt called and stated she is feeling worse. She had a telephone visit on 10/25. She stated she has now developed a fever and wheezing. Stated fever is 100.1 and she has a headache as well. I asked her if she would be able to come in for a office visit and she said no. She asked if antibiotic could be sent in.

## 2022-02-25 ENCOUNTER — Ambulatory Visit (HOSPITAL_BASED_OUTPATIENT_CLINIC_OR_DEPARTMENT_OTHER): Payer: 59

## 2022-02-25 ENCOUNTER — Telehealth (HOSPITAL_BASED_OUTPATIENT_CLINIC_OR_DEPARTMENT_OTHER): Payer: Self-pay

## 2022-02-25 ENCOUNTER — Other Ambulatory Visit (HOSPITAL_BASED_OUTPATIENT_CLINIC_OR_DEPARTMENT_OTHER): Payer: Self-pay | Admitting: Family Medicine

## 2022-02-25 MED ORDER — BENZONATATE 200 MG PO CAPS: 200.0000 mg | ORAL_CAPSULE | Freq: Three times a day (TID) | ORAL | 0 refills | Status: AC | PRN

## 2022-02-25 NOTE — Telephone Encounter (Signed)
Pt called and requested cough medicine to be sent to the pharmacy. She stated she is still having a cough and the antibiotics didn't help. Do she need to make an appointment to come into the office ?

## 2022-03-01 DIAGNOSIS — J209 Acute bronchitis, unspecified: Secondary | ICD-10-CM | POA: Diagnosis not present

## 2022-03-01 DIAGNOSIS — J018 Other acute sinusitis: Secondary | ICD-10-CM | POA: Diagnosis not present

## 2022-03-03 ENCOUNTER — Ambulatory Visit: Payer: Self-pay | Admitting: Family Medicine

## 2022-03-04 ENCOUNTER — Encounter (HOSPITAL_BASED_OUTPATIENT_CLINIC_OR_DEPARTMENT_OTHER): Payer: 59 | Admitting: Family Medicine

## 2022-03-11 ENCOUNTER — Ambulatory Visit (HOSPITAL_BASED_OUTPATIENT_CLINIC_OR_DEPARTMENT_OTHER): Payer: 59 | Admitting: Cardiology

## 2022-07-06 ENCOUNTER — Other Ambulatory Visit (HOSPITAL_BASED_OUTPATIENT_CLINIC_OR_DEPARTMENT_OTHER): Payer: Self-pay | Admitting: Family Medicine

## 2022-07-06 DIAGNOSIS — Z780 Asymptomatic menopausal state: Secondary | ICD-10-CM

## 2022-07-12 ENCOUNTER — Other Ambulatory Visit (HOSPITAL_BASED_OUTPATIENT_CLINIC_OR_DEPARTMENT_OTHER): Payer: 59

## 2022-07-19 ENCOUNTER — Ambulatory Visit (HOSPITAL_BASED_OUTPATIENT_CLINIC_OR_DEPARTMENT_OTHER)
Admission: RE | Admit: 2022-07-19 | Discharge: 2022-07-19 | Disposition: A | Discharge status: Home / Self Care | Payer: Medicare Other | Source: Ambulatory Visit | Attending: Family Medicine | Admitting: Family Medicine

## 2022-07-19 DIAGNOSIS — Z780 Asymptomatic menopausal state: Secondary | ICD-10-CM | POA: Diagnosis not present

## 2022-09-07 ENCOUNTER — Ambulatory Visit: Payer: Self-pay | Admitting: Family Medicine

## 2022-09-21 ENCOUNTER — Ambulatory Visit (HOSPITAL_BASED_OUTPATIENT_CLINIC_OR_DEPARTMENT_OTHER): Payer: 59 | Admitting: Family Medicine

## 2022-10-05 ENCOUNTER — Ambulatory Visit (HOSPITAL_BASED_OUTPATIENT_CLINIC_OR_DEPARTMENT_OTHER): Payer: 59 | Admitting: Family Medicine

## 2022-10-07 ENCOUNTER — Other Ambulatory Visit: Payer: Self-pay

## 2022-10-07 ENCOUNTER — Emergency Department (HOSPITAL_BASED_OUTPATIENT_CLINIC_OR_DEPARTMENT_OTHER)
Admission: EM | Admit: 2022-10-07 | Discharge: 2022-10-07 | Disposition: A | Discharge status: Home / Self Care | Payer: Medicare Other | Attending: Emergency Medicine | Admitting: Emergency Medicine

## 2022-10-07 ENCOUNTER — Encounter (HOSPITAL_BASED_OUTPATIENT_CLINIC_OR_DEPARTMENT_OTHER): Payer: Self-pay

## 2022-10-07 DIAGNOSIS — S29012A Strain of muscle and tendon of back wall of thorax, initial encounter: Secondary | ICD-10-CM | POA: Diagnosis not present

## 2022-10-07 DIAGNOSIS — Y92007 Garden or yard of unspecified non-institutional (private) residence as the place of occurrence of the external cause: Secondary | ICD-10-CM | POA: Insufficient documentation

## 2022-10-07 DIAGNOSIS — M546 Pain in thoracic spine: Secondary | ICD-10-CM | POA: Diagnosis present

## 2022-10-07 DIAGNOSIS — Y9389 Activity, other specified: Secondary | ICD-10-CM | POA: Insufficient documentation

## 2022-10-07 DIAGNOSIS — Y990 Civilian activity done for income or pay: Secondary | ICD-10-CM | POA: Diagnosis not present

## 2022-10-07 DIAGNOSIS — X58XXXA Exposure to other specified factors, initial encounter: Secondary | ICD-10-CM | POA: Diagnosis not present

## 2022-10-07 LAB — BASIC METABOLIC PANEL
Anion gap: 8 (ref 5–15)
BUN: 9 mg/dL (ref 8–23)
CO2: 27 mmol/L (ref 22–32)
Calcium: 9.5 mg/dL (ref 8.9–10.3)
Chloride: 105 mmol/L (ref 98–111)
Creatinine, Ser: 0.82 mg/dL (ref 0.44–1.00)
GFR, Estimated: >60 mL/min (ref >60)
Glucose, Bld: 106 mg/dL — ABNORMAL HIGH (ref 70–99)
Potassium: 3.5 mmol/L (ref 3.5–5.1)
Sodium: 140 mmol/L (ref 135–145)

## 2022-10-07 LAB — CBC
HCT: 39 % (ref 36.0–46.0)
Hemoglobin: 13 g/dL (ref 12.0–15.0)
MCH: 26.7 pg (ref 26.0–34.0)
MCHC: 33.3 g/dL (ref 30.0–36.0)
MCV: 80.2 fL (ref 80.0–100.0)
Platelets: 309 10*3/uL (ref 150–400)
RBC: 4.86 MIL/uL (ref 3.87–5.11)
RDW: 16.7 % — ABNORMAL HIGH (ref 11.5–15.5)
WBC: 7.6 10*3/uL (ref 4.0–10.5)
nRBC: 0 % (ref 0.0–0.2)

## 2022-10-07 MED ORDER — LIDOCAINE 5 % EX PTCH
1.0000 | MEDICATED_PATCH | Freq: Once | CUTANEOUS | Status: DC
  Administered 2022-10-07: 1 via TRANSDERMAL
  Filled 2022-10-07: qty 1

## 2022-10-07 MED ORDER — KETOROLAC TROMETHAMINE 30 MG/ML IJ SOLN
15.0000 mg | Freq: Once | INTRAMUSCULAR | Status: AC
  Administered 2022-10-07: 15 mg via INTRAMUSCULAR
  Filled 2022-10-07: qty 1

## 2022-10-07 MED ORDER — ACETAMINOPHEN 500 MG PO TABS
1000.0000 mg | ORAL_TABLET | Freq: Once | ORAL | Status: AC
  Administered 2022-10-07: 1000 mg via ORAL
  Filled 2022-10-07: qty 2

## 2022-10-07 NOTE — Discharge Instructions (Signed)
You were seen in the emergency department for your back pain.  It is likely due to a muscle strain or spasm from your recent yardwork.  You can take Tylenol and Motrin as needed for pain as well as use lidocaine patches.  You should try to stretch and do your normal activities as best you can try to avoid lifting more than 10 pounds in the next couple of days to let your back heal.  You can follow-up with your primary doctor to have your symptoms rechecked.  You should return to the emergency department if you develop numbness or weakness in your arms or legs, difficulty breathing, or any other new or concerning symptoms.

## 2022-10-07 NOTE — ED Provider Notes (Signed)
[place] EMERGENCY DEPARTMENT AT [facility] Provider Note   CSN: [id number] Arrival date & time: 10/07/22  1549     History  Chief Complaint  Patient presents with   Back Pain    Lindsey Morales is a 67 y.o. female.  Patient is a 67 year old female with no significant past medical history presenting to the emergency department with back pain.  She states that on Wednesday she was cleaning her car and spraying bugs around the house and doing a lot of yard work and then yesterday started to develop pain in her mid back around her shoulder blades.  She states that pain has been constant since it started.  She denies any trauma or falls.  She denies any radiation of the pain into her chest or abdomen.  She denies any associated numbness or weakness or shortness of breath.  She states that she has not tried anything for the pain yet at home.  The history is provided by the patient.  Back Pain      Home Medications Prior to Admission medications   Medication Sig Start Date End Date Taking? Authorizing Provider  benzonatate (TESSALON) 200 MG capsule Take 1 capsule (200 mg total) by mouth 3 (three) times daily as needed for cough. 02/25/22   [place], Raymond J, MD  pantoprazole (PROTONIX) 40 MG tablet Take 40 mg by mouth 2 (two) times a week.    [provider]      Allergies    Meclizine    Review of Systems   Review of Systems  Musculoskeletal:  Positive for back pain.    Physical Exam Updated Vital Signs BP (!) 168/113 (BP Location: Right Arm)   Pulse 97   Temp 98.4 F (36.9 C)   Resp (!) 22   Ht 5\' 7"  (1.702 m)   Wt 78 kg   SpO2 100%   BMI 26.94 kg/m  Physical Exam Vitals and nursing note reviewed.  Constitutional:      General: She is not in acute distress.    Appearance: Normal appearance.  HENT:     Head: Normocephalic and atraumatic.     Nose: Nose normal.     Mouth/Throat:     Mouth: Mucous membranes are moist.  Eyes:      Extraocular Movements: Extraocular movements intact.     Conjunctiva/sclera: Conjunctivae normal.  Neck:     Comments: No midline neck tenderness Cardiovascular:     Rate and Rhythm: Normal rate and regular rhythm.     Pulses: Normal pulses.     Heart sounds: Normal heart sounds.  Pulmonary:     Effort: Pulmonary effort is normal.     Breath sounds: Normal breath sounds.  Abdominal:     General: Abdomen is flat.     Palpations: Abdomen is soft.     Tenderness: There is no abdominal tenderness.  Musculoskeletal:        General: Normal range of motion.     Cervical back: Normal range of motion and neck supple.     Comments: No midline back tenderness Upper thoracic tenderness on the right greater than left below shoulder blades bilaterally, no overlying skin changes  Skin:    General: Skin is warm and dry.     Findings: No rash.  Neurological:     General: No focal deficit present.     Mental Status: She is alert and oriented to person, place, and time.     Sensory: No sensory  deficit.     Motor: No weakness (5 out of 5 strength in all 4 extremities).  Psychiatric:        Mood and Affect: Mood normal.        Behavior: Behavior normal.     ED Results / Procedures / Treatments   Labs (all labs ordered are listed, but only abnormal results are displayed) Labs Reviewed  BASIC METABOLIC PANEL - Abnormal; Notable for the following components:      Result Value   Glucose, Bld 106 (*)    All other components within normal limits  CBC - Abnormal; Notable for the following components:   RDW 16.7 (*)    All other components within normal limits    EKG EKG Interpretation  Date/Time:  Thursday October 07 2022 15:58:47 EDT Ventricular Rate:  94 PR Interval:  138 QRS Duration: 84 QT Interval:  364 QTC Calculation: 455 R Axis:   71 Text Interpretation: Normal sinus rhythm Normal ECG No previous ECGs available Confirmed by Elayne Snare ([id number]) on 10/07/2022 4:52:23  PM  Radiology No results found.  Procedures Procedures    Medications Ordered in ED Medications  acetaminophen (TYLENOL) tablet 1,000 mg (has no administration in time range)  ketorolac (TORADOL) 30 MG/ML injection 15 mg (has no administration in time range)  lidocaine (LIDODERM) 5 % 1-3 patch (has no administration in time range)    ED Course/ Medical Decision Making/ A&P                             Medical Decision Making This patient presents to the ED with chief complaint(s) of back pain with no pertinent past medical history which further complicates the presenting complaint. The complaint involves an extensive differential diagnosis and also carries with it a high risk of complications and morbidity.    The differential diagnosis includes muscle strain or spasm, she has no midline back tenderness making fracture unlikely, she has no focal neurologic deficits making spinal cord pathology unlikely, no associated chest pain or shortness of breath making pneumonia or pneumothorax unlikely  Additional history obtained: Additional history obtained from N/A Records reviewed Primary Care Documents  ED Course and Reassessment: On patient's arrival to the emergency department she was hemodynamically stable in no acute distress.  She was initially evaluated by triage and had labs and EKG performed.  EKG showed normal sinus rhythm without acute ischemic changes and labs are within normal range.  Patient does have reproducible back pain on the right greater than the left below her shoulder blades consistent with muscle strain or spasm.  The patient will be given Tylenol, Toradol and a lidocaine patch and was recommended continued symptomatic management with close primary care follow-up.  She was given strict return precautions.  Independent labs interpretation:  The following labs were independently interpreted: Within normal range  Independent visualization of imaging: -  N/A  Consultation: - Consulted or discussed management/test interpretation w/ external professional: N/A  Consideration for admission or further workup: Patient has no emergent conditions requiring admission or further work-up at this time and is stable for discharge home with primary care follow-up  Social Determinants of health: N/A    Amount and/or Complexity of Data Reviewed Labs: ordered.  Risk OTC drugs. Prescription drug management.          Final Clinical Impression(s) / ED Diagnoses Final diagnoses:  Strain of thoracic paraspinal muscles excluding T1 and T2 levels, initial encounter  Rx / DC Orders ED Discharge Orders     None         Rexford Maus, DO 10/07/22 1710

## 2022-10-07 NOTE — ED Triage Notes (Signed)
Patient here POV from Home.  Endorses washing her Car and House Tuesday and woke up Last PM with some Mid Back Pain. (Across the Mid). No Lower Back Pain. No Dysuria. No Trauma or Injury noted. No SOB but Painful with Deep inspiration.   NAD Noted during Triage. A&Ox4. GCS 15. Ambulatory.

## 2022-10-13 ENCOUNTER — Ambulatory Visit (HOSPITAL_BASED_OUTPATIENT_CLINIC_OR_DEPARTMENT_OTHER): Payer: 59 | Admitting: Family Medicine

## 2022-12-22 ENCOUNTER — Ambulatory Visit (HOSPITAL_BASED_OUTPATIENT_CLINIC_OR_DEPARTMENT_OTHER): Payer: 59 | Admitting: Radiology

## 2022-12-22 ENCOUNTER — Other Ambulatory Visit (HOSPITAL_BASED_OUTPATIENT_CLINIC_OR_DEPARTMENT_OTHER): Payer: Self-pay | Admitting: Family Medicine

## 2022-12-22 DIAGNOSIS — Z1231 Encounter for screening mammogram for malignant neoplasm of breast: Secondary | ICD-10-CM

## 2023-01-12 ENCOUNTER — Other Ambulatory Visit (HOSPITAL_BASED_OUTPATIENT_CLINIC_OR_DEPARTMENT_OTHER): Payer: Self-pay | Admitting: Family Medicine

## 2023-01-12 ENCOUNTER — Ambulatory Visit (HOSPITAL_BASED_OUTPATIENT_CLINIC_OR_DEPARTMENT_OTHER)
Admission: RE | Admit: 2023-01-12 | Discharge: 2023-01-12 | Disposition: A | Discharge status: Home / Self Care | Payer: Medicare Other | Source: Ambulatory Visit | Attending: Family Medicine | Admitting: Family Medicine

## 2023-01-12 DIAGNOSIS — R058 Other specified cough: Secondary | ICD-10-CM

## 2023-02-10 ENCOUNTER — Ambulatory Visit (HOSPITAL_BASED_OUTPATIENT_CLINIC_OR_DEPARTMENT_OTHER)
Admission: RE | Admit: 2023-02-10 | Discharge: 2023-02-10 | Disposition: A | Discharge status: Home / Self Care | Payer: Medicare Other | Source: Ambulatory Visit | Attending: Family Medicine | Admitting: Family Medicine

## 2023-02-10 DIAGNOSIS — Z1231 Encounter for screening mammogram for malignant neoplasm of breast: Secondary | ICD-10-CM | POA: Insufficient documentation

## 2023-03-17 ENCOUNTER — Encounter (HOSPITAL_BASED_OUTPATIENT_CLINIC_OR_DEPARTMENT_OTHER): Payer: Self-pay | Admitting: Family Medicine

## 2023-03-29 ENCOUNTER — Ambulatory Visit (HOSPITAL_BASED_OUTPATIENT_CLINIC_OR_DEPARTMENT_OTHER): Payer: Medicare Other | Admitting: Family Medicine

## 2023-05-06 IMAGING — DX DG LUMBAR SPINE COMPLETE 4+V
5 series · 5 of 5 positions shown · non-contrast
Comparison: Lumbar spine radiographs 12/20/2011

CLINICAL DATA: Lower back pain.

EXAM:
LUMBAR SPINE - COMPLETE 4+ VIEW

[l-spine ap]
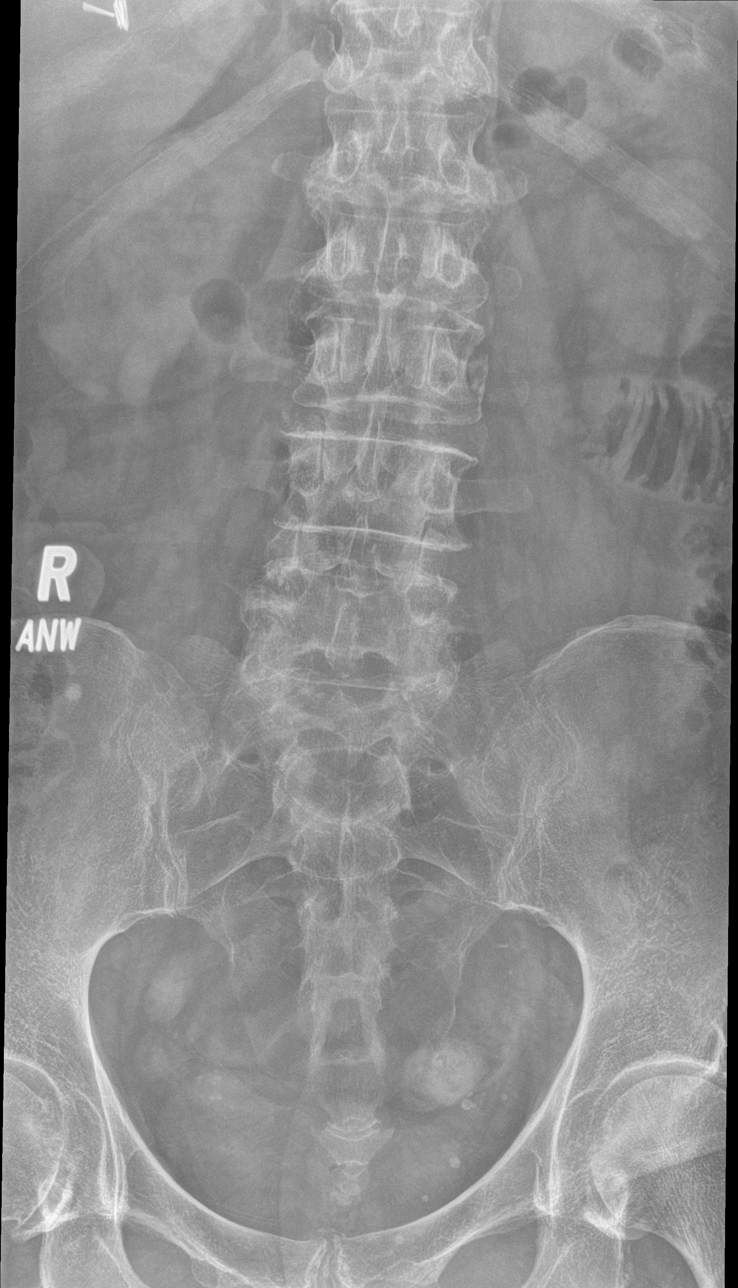

[l-spine obl (1 of 2)]
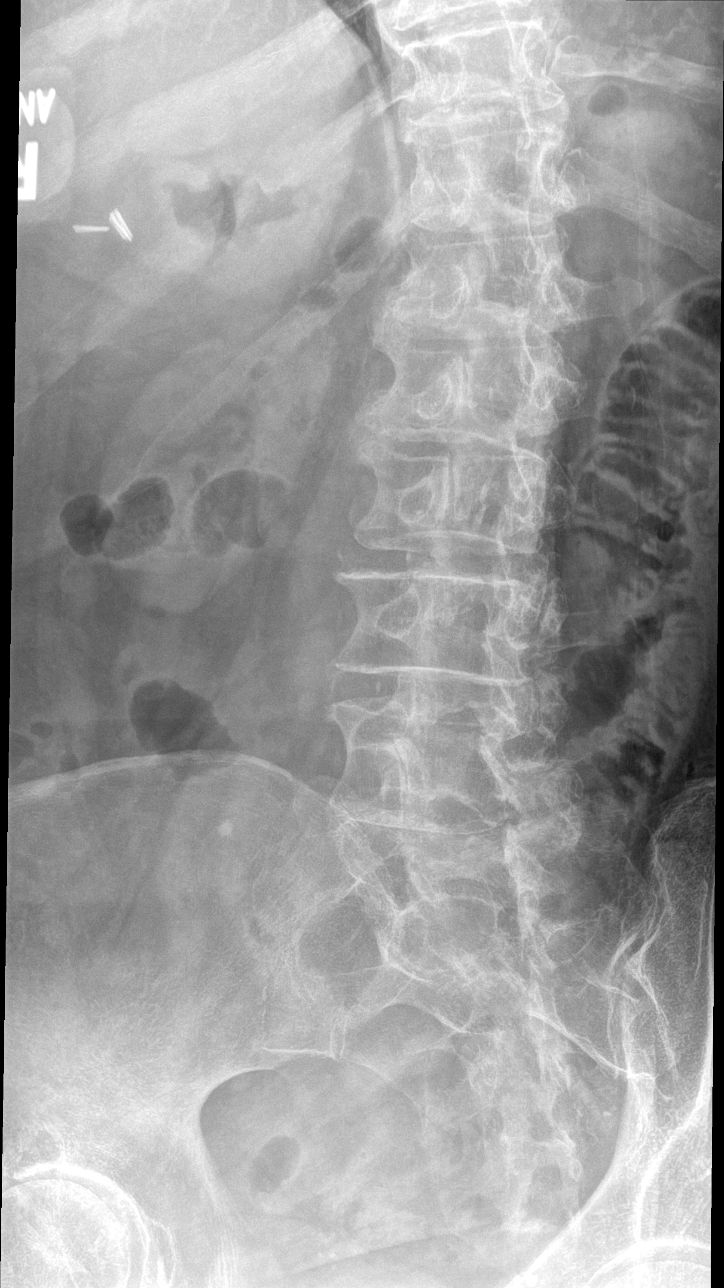

[l-spine obl (2 of 2)]
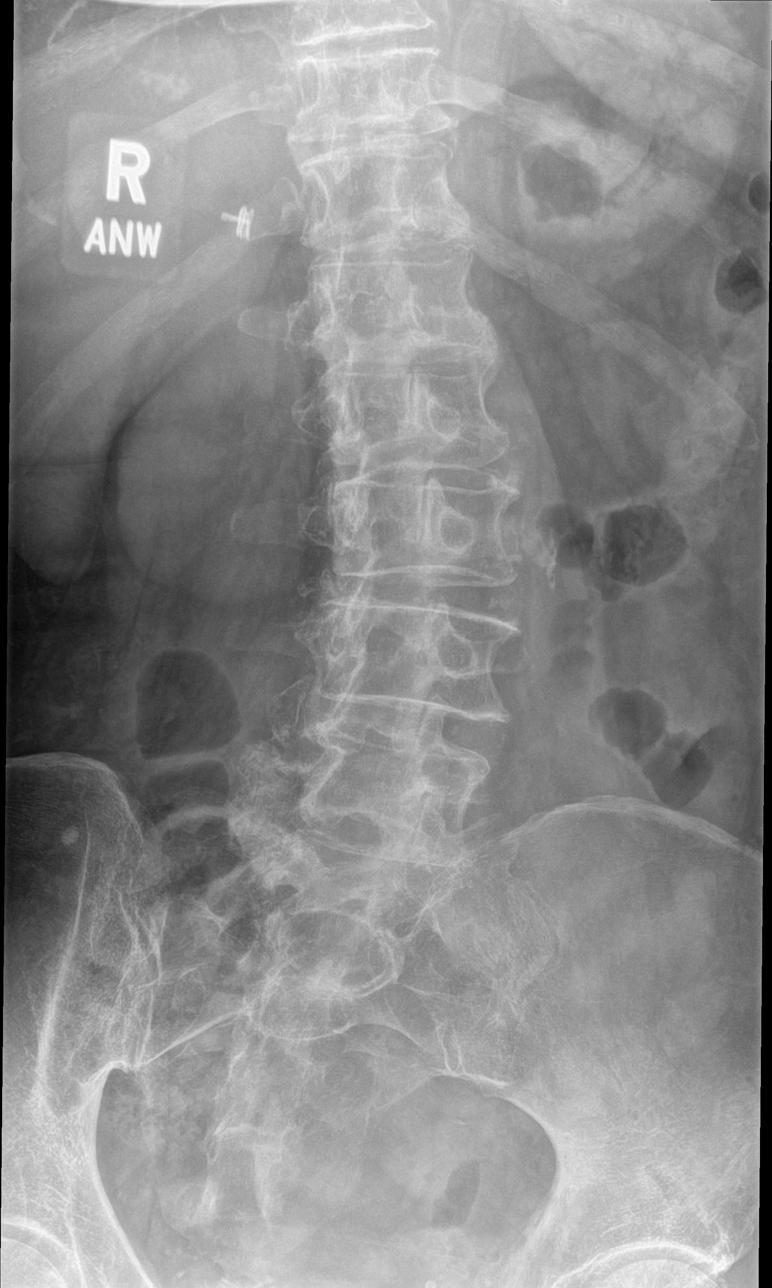

[l-spine lat]
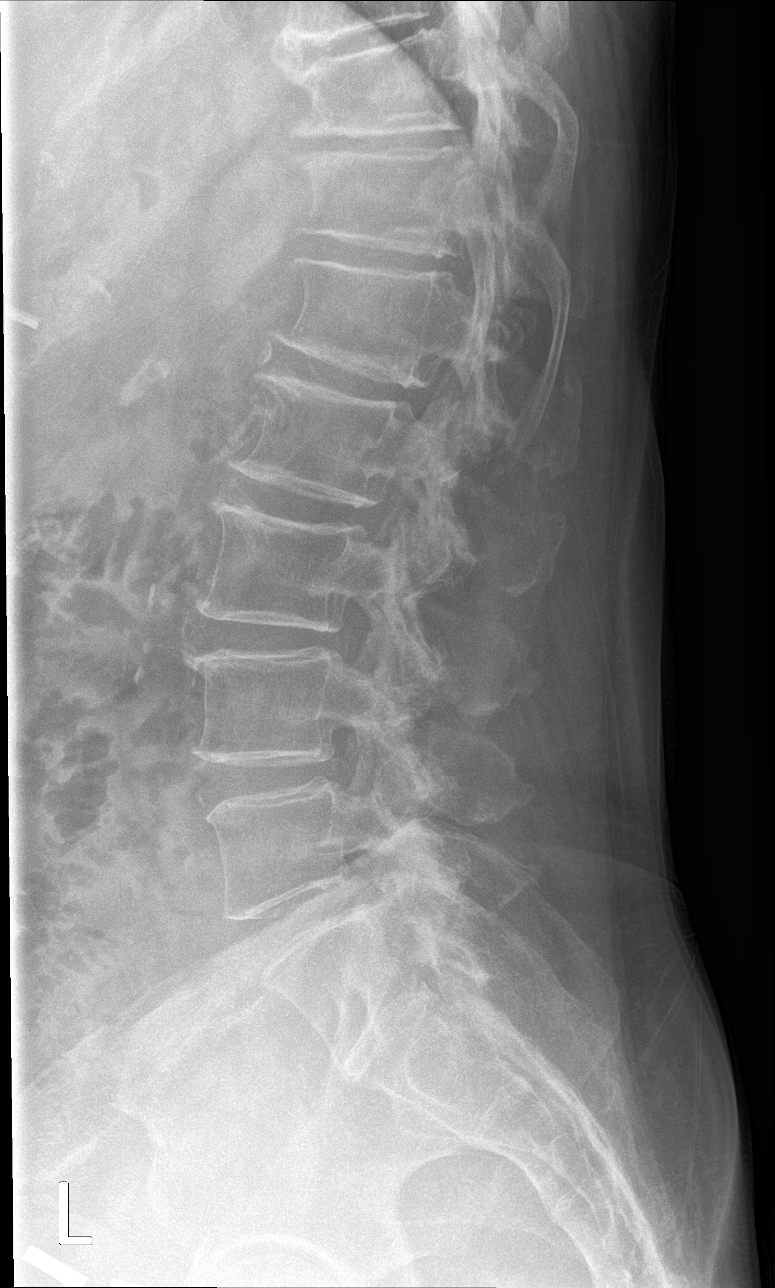

[l-spine spot]
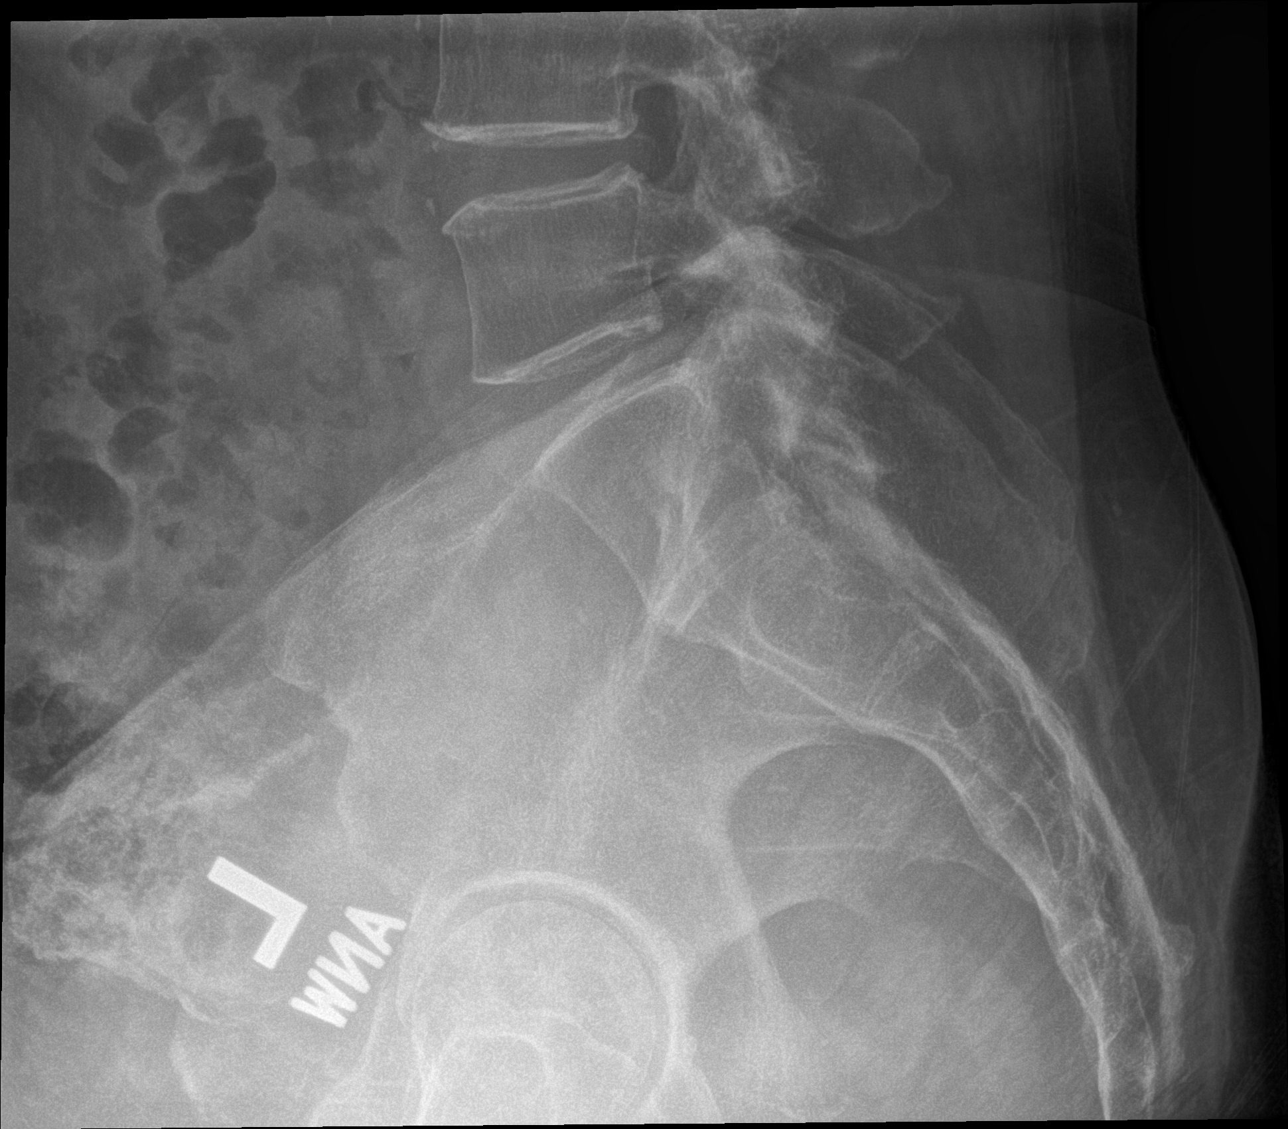

[5 of 5 positions shown; findings below may reference images not displayed]

FINDINGS: There are 5 non-rib-bearing lumbar-type vertebral bodies. There is
partial lumbarization of S1.

Minimal levocurvature centered at L2 unchanged from prior.

Mild anterior T11, T12, and L1 vertebral body height loss is
unchanged from prior and chronic.

No significant disc space narrowing. Mild anterior L1-2 through
L3-4, fully bridging anterior T10-11, and moderate anterior T11-12
endplate osteophytes.
IMPRESSION: :
IMPRESSION: 1. Minimal levocurvature centered at L2.
2. Chronic anterior height loss of the T11 through L1 vertebral
bodies.
3. Mild multilevel endplate degenerative changes.

## 2023-05-06 IMAGING — DX DG HAND COMPLETE 3+V*R*
3 series · 3 of 3 positions shown · non-contrast
Comparison: None Available.

CLINICAL DATA: Right hand pain at the first carpometacarpal joint.

EXAM:
RIGHT HAND - COMPLETE 3+ VIEW

[hand ap]
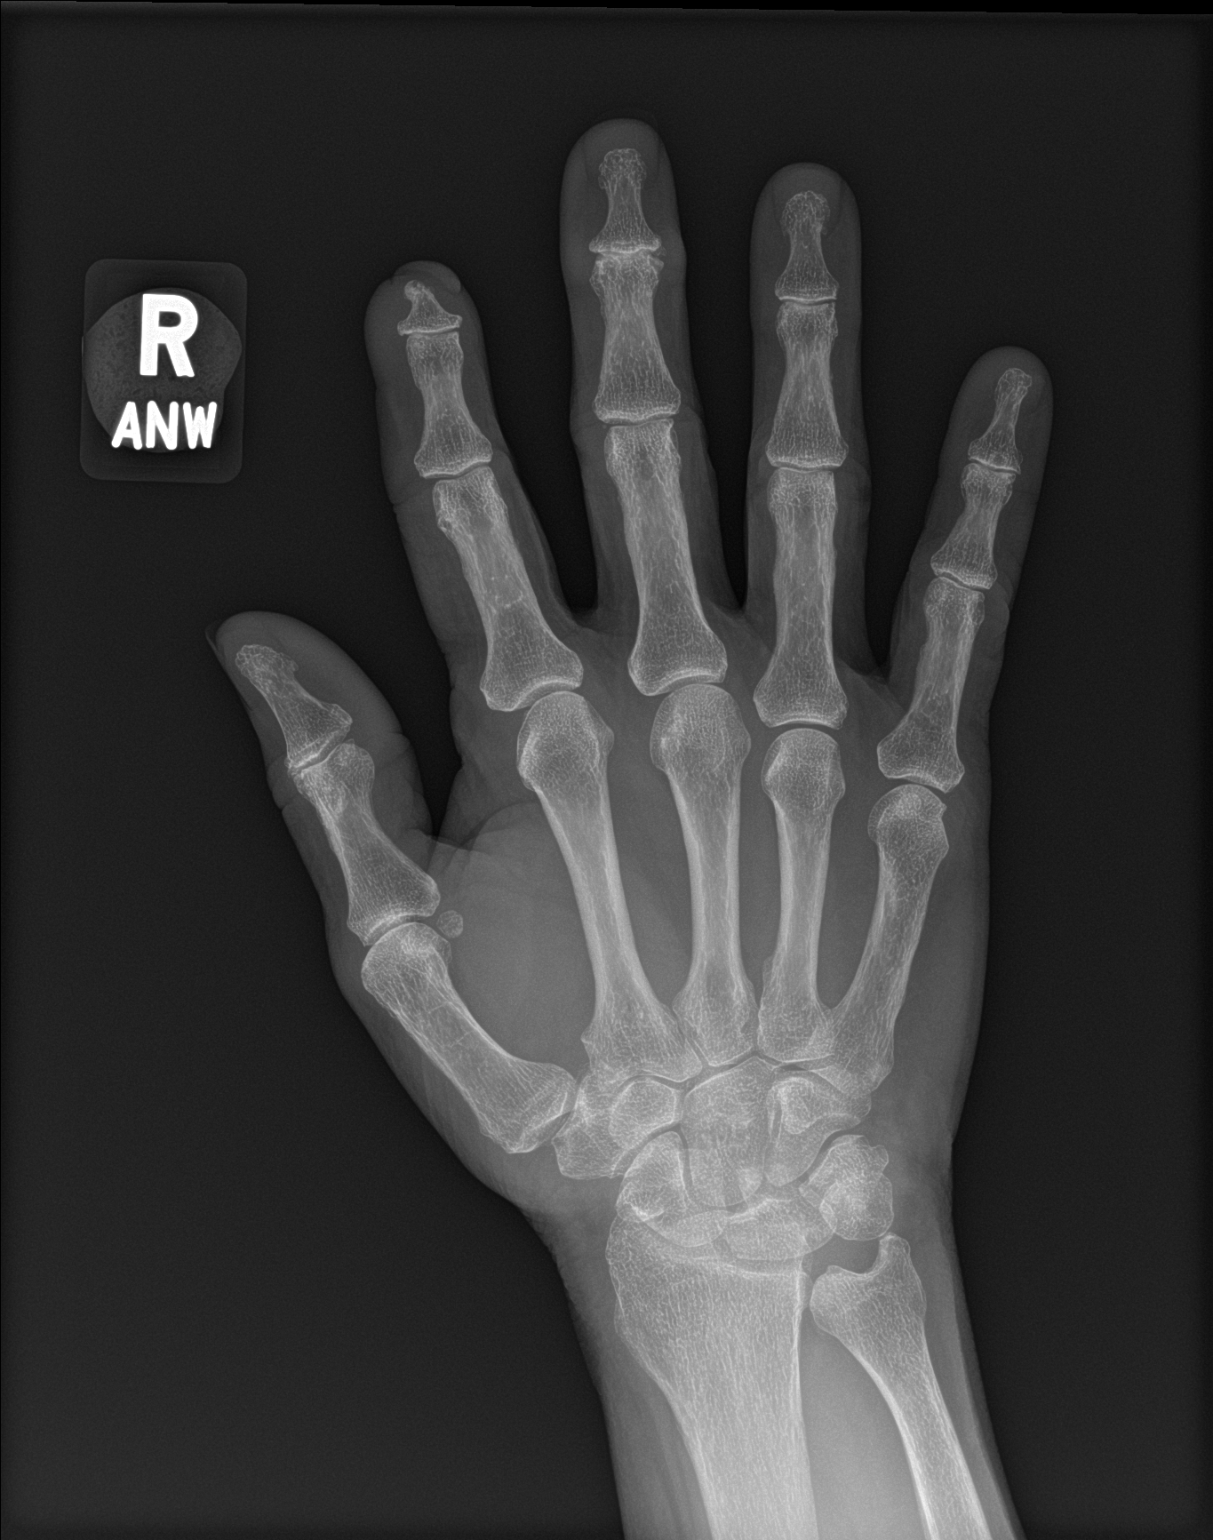

[hand lat]
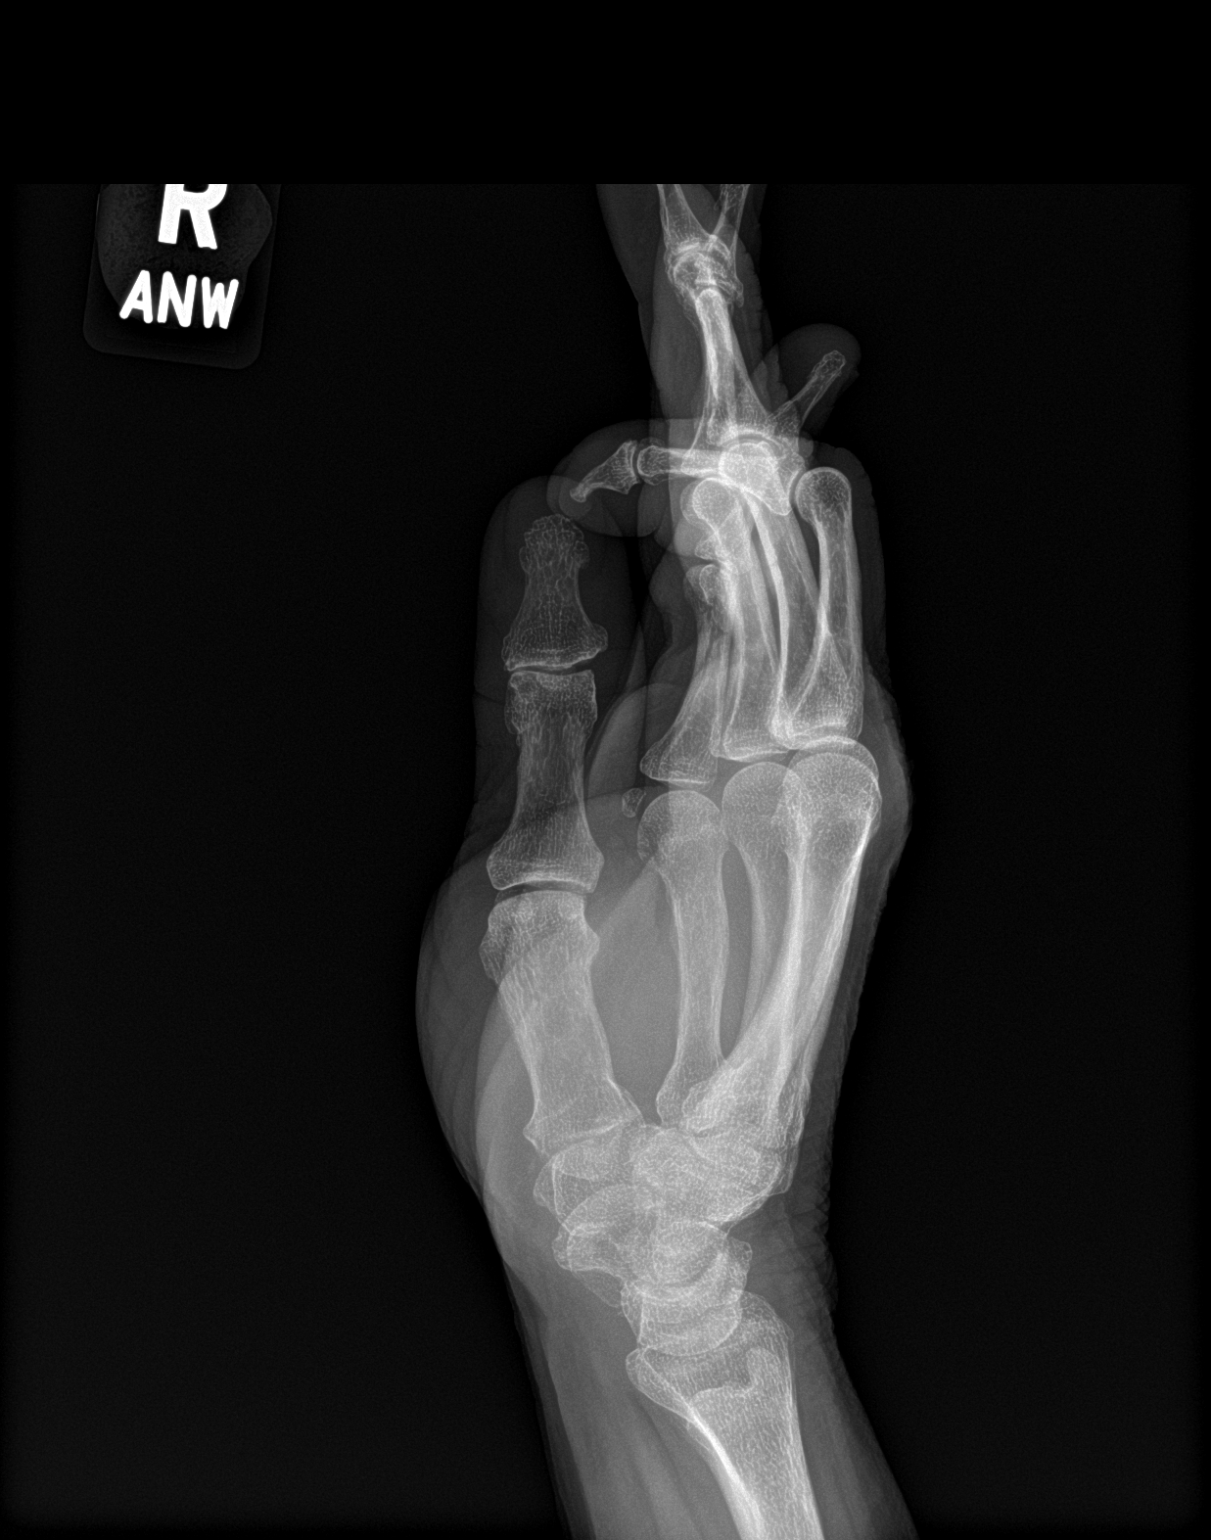

[hand obl]
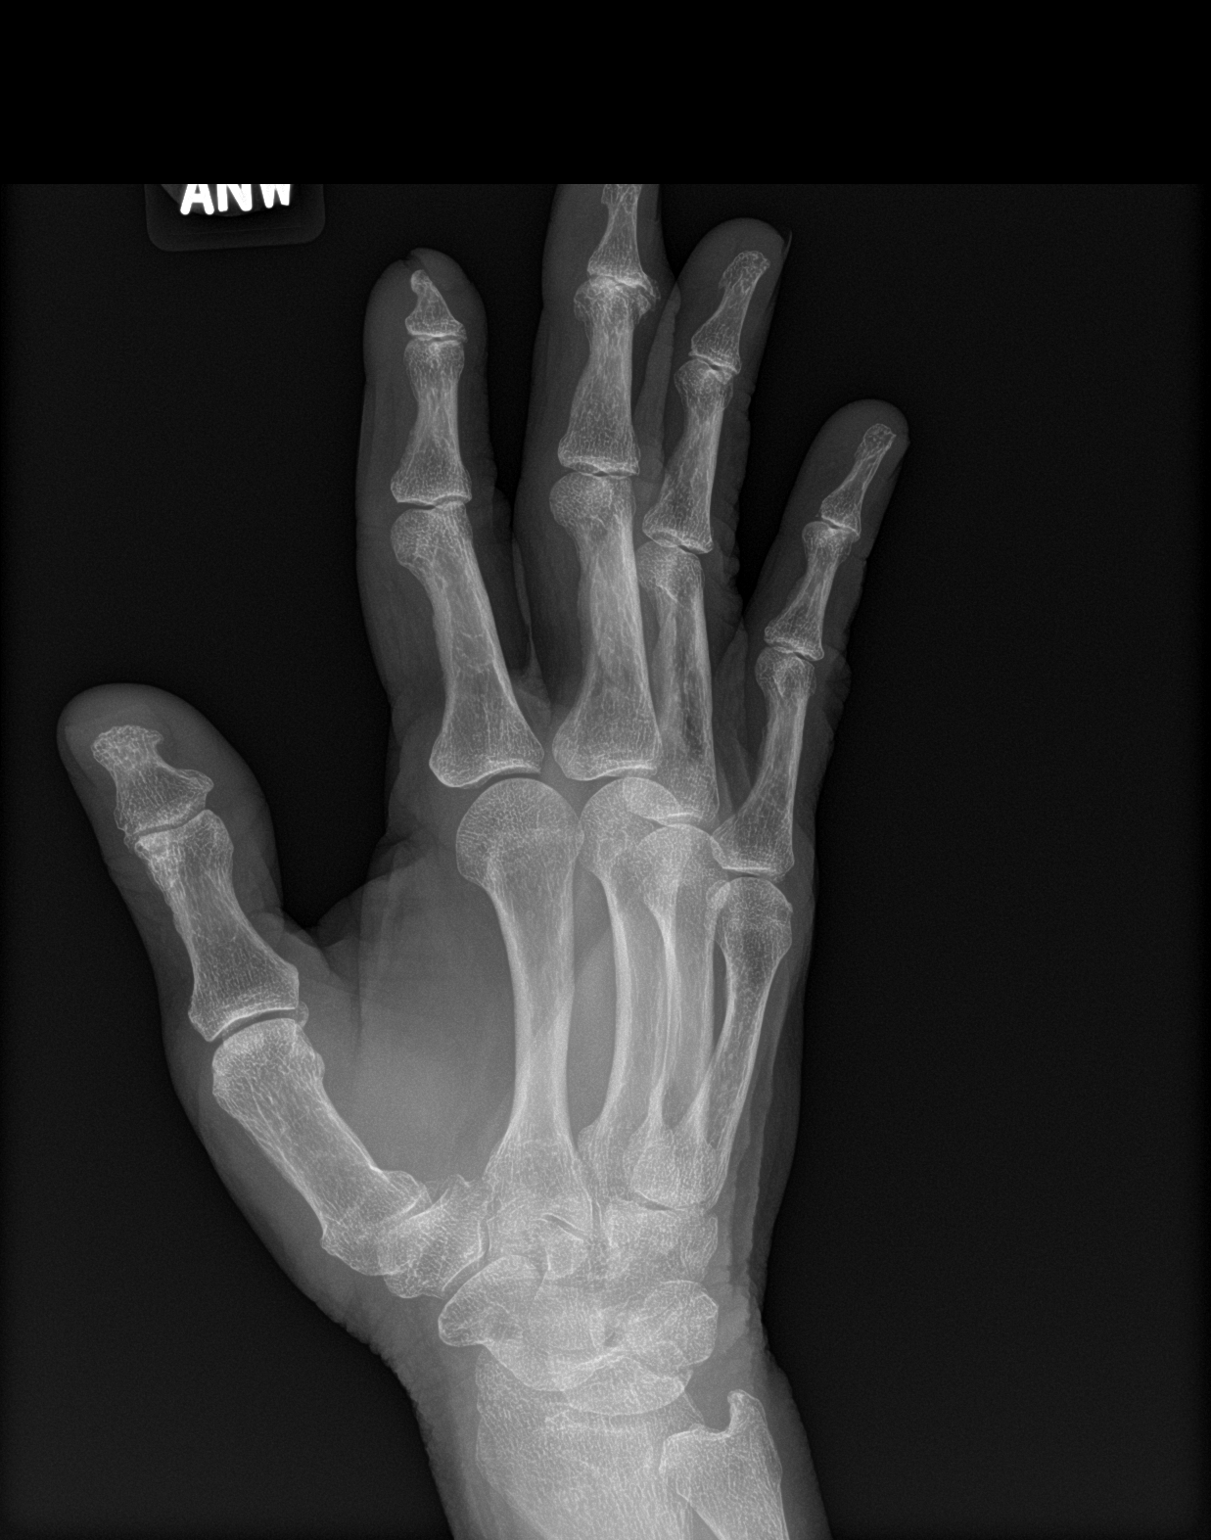

[3 of 3 positions shown; findings below may reference images not displayed]

FINDINGS: Mild thumb carpometacarpal and triscaphe joint space narrowing. Mild
peripheral thumb carpometacarpal degenerative osteophytosis. Minimal
thumb interphalangeal joint space narrowing.

Moderate second and severe third DIP joint space narrowing and
peripheral osteophytosis. There is a foreshortened distal aspect of
the index finger distal phalanx which appears chronic and may
represent the sequela of remote trauma and/or surgery. More mild
joint space narrowing of the fourth and fifth DIP joints.
IMPRESSION: :
IMPRESSION: 1. Mild-to-moderate thumb carpometacarpal osteoarthritis.
2. Severe third and moderate second DIP osteoarthritis.
3. No acute fracture is seen.

## 2023-08-22 ENCOUNTER — Ambulatory Visit (INDEPENDENT_AMBULATORY_CARE_PROVIDER_SITE_OTHER): Admitting: Obstetrics

## 2023-08-22 ENCOUNTER — Encounter: Payer: Self-pay | Admitting: Obstetrics

## 2023-08-22 ENCOUNTER — Other Ambulatory Visit (HOSPITAL_COMMUNITY)
Admission: RE | Admit: 2023-08-22 | Discharge: 2023-08-22 | Disposition: A | Discharge status: Home / Self Care | Attending: Obstetrics | Admitting: Obstetrics

## 2023-08-22 VITALS — BP 148/87 | HR 80 | Ht 64.25 in | Wt 184.0 lb

## 2023-08-22 DIAGNOSIS — N952 Postmenopausal atrophic vaginitis: Secondary | ICD-10-CM | POA: Insufficient documentation

## 2023-08-22 DIAGNOSIS — R319 Hematuria, unspecified: Secondary | ICD-10-CM | POA: Diagnosis present

## 2023-08-22 DIAGNOSIS — N939 Abnormal uterine and vaginal bleeding, unspecified: Secondary | ICD-10-CM

## 2023-08-22 DIAGNOSIS — N816 Rectocele: Secondary | ICD-10-CM | POA: Insufficient documentation

## 2023-08-22 LAB — POCT URINALYSIS DIPSTICK
Bilirubin, UA: NEGATIVE
Glucose, UA: NEGATIVE
Ketones, UA: NEGATIVE
Leukocytes, UA: NEGATIVE
Nitrite, UA: NEGATIVE
Protein, UA: NEGATIVE
Spec Grav, UA: <=1.005 — AB (ref 1.010–1.025)
Urobilinogen, UA: 0.2 U/dL
pH, UA: 6 (ref 5.0–8.0)

## 2023-08-22 LAB — URINALYSIS, ROUTINE W REFLEX MICROSCOPIC
Bilirubin Urine: NEGATIVE
Glucose, UA: NEGATIVE mg/dL
Ketones, ur: NEGATIVE mg/dL
Leukocytes,Ua: NEGATIVE
Nitrite: NEGATIVE
Protein, ur: NEGATIVE mg/dL
Specific Gravity, Urine: 1.005 (ref 1.005–1.030)
pH: 6 (ref 5.0–8.0)

## 2023-08-22 MED ORDER — ESTRADIOL 0.1 MG/GM VA CREA
TOPICAL_CREAM | VAGINAL | 3 refills | Status: DC
Start: 2023-08-25 — End: 2023-08-22

## 2023-08-22 MED ORDER — ESTRADIOL 0.1 MG/GM VA CREA
TOPICAL_CREAM | VAGINAL | 3 refills | Status: AC
Start: 2023-08-25 — End: ?

## 2023-08-22 NOTE — Progress Notes (Signed)
 New Patient Evaluation and Consultation  Referring Provider: Belenda Bowie PCP: Belenda Bowie Date of Service: 08/22/2023  SUBJECTIVE Chief Complaint: New Patient (Initial Visit) Lindsey Morales is a 68 y.o. female here today for /)  History of Present Illness: Lindsey Morales is a 68 y.o. White or Caucasian female seen in consultation at the request of PA Royston Cornea for evaluation of gross hematuria vs. vaginal bleeding.    Noticed vaginal bleeding 10 years ago when she visited [place], [place] to go shopping at [place]. Tissue inside vagina was full of blood when she wipes postvoid, denies blood in toilet or in urine. Denies dysuria, increased urinary frequency/urgency. Spontaneously resolved after 3-4 weeks and then returned.  Denies urology workup.  Recurrent vaginal vs. urethral bleeding requiring pad use. Denies hematuria during voids with intermittent dysuria but reports blood on toilet paper when she wipes postvoid. S/p augmentin - stopped tobacco use 13 years ago. 1 pack/week for 10 years  - denies pelvic radiation, history of chemotherapy, family history of kidney cancer, or exposure to chemicals at work. Husband passed away from kidney cancer around 4 weeks Evaluated by Dr. Camilo Cella (urogyn) at [facility] in 2021 for grade I-II rectocele, recommended to avoid straining and start fiber supplementation with 1 year follow-up. Reports going to a "bladder specialist" around 8 years ago due to vaginal bulge.  Reports increased vaginal bleeding noted when she strains with constipation  TVUS 03/28/2023 WNL, unable to visualize ovaries.  Evaluated by Dr. Annabell Key in 02/12/22 for postmenopausal bleeding for 2 weeks that spontaneously resolved. NILM pap smear.  Evaluated 01/29/22 for bladder and pelvic pressure with low back pain. Symptoms resolved after treatment with Macrobid  for presumed UTI with UA cloudy, + heme/leuk. No culture available for review.   Review of records  significant for: H/o H. Pylori  Arlena Belts, MD - 03/28/2023 Formatting of this note might be different from the original. US  PELVIS TRANSABDOMINAL WITH TRANSVAG, 03/28/2023 10:40 AM  INDICATION: post-hysterectomy vaginal bleeding, Abnormal uterine and vaginal bleeding, unspecified \ N93.9 Abnormal uterine and vaginal bleeding, unspecified post-hysterectomy vaginal bleeding COMPARISON: None.  TRANSABDOMINAL EXAM:  TECHNIQUE: Multi-planar real-time grayscale ultrasound of the pelvis via a transabdominal approach was performed, supplemented by color and/or power Doppler for limited purposes of general vascularity evaluation.  FINDINGS:  .  Uterus: Surgically absent. .  Right ovary/adnexa: Not visualized. .  Left ovary/adnexa: Not visualized. .  Bladder: Unremarkable. .  Peritoneum: No significant fluid or other abnormality identified.  TRANSVAGINAL EXAM:  TECHNIQUE: Multi-planar real-time grayscale ultrasound of the pelvis via a transvaginal approach was performed, supplemented by color and/or power Doppler for limited purposes of general vascularity evaluation.  FINDINGS:  UTERUS: Surgically absent. No evidence of hematoma in the operative bed. Vaginal cuff unable to be visualized in any detail.  RIGHT Adnexa: .  Ovary: Not identified. .  Other: No additional abnormalities of note.  LEFT Adnexa: .  Ovary: Not identified. .  Other: No additional abnormalities of note.  Peritoneum: No fluid in the pelvic cul-de-sac.  IMPRESSION:  Status post hysterectomy with no apparent hematoma in the operative bed or elsewhere in the imaged pelvis.  Ovaries not visualized, likely due to a combination of atrophy and obscuration by bowel gas. Exam End: 03/28/23 10:40   Specimen Collected: 03/28/23 11:13 Last Resulted: 03/28/23 11:17  Received From: [facility]  Result Received: 08/22/23 12:03    Urinary Symptoms: Does not leak urine.   Day time voids 6-8.  Nocturia:  0-1 times per  night to void, increases when she drinks water at bedtime  Voiding dysfunction:  empties bladder well.  Patient does not use a catheter to empty bladder.  When urinating, patient feels she has no difficulties Drinks: 120-180oz water per day  UTIs:  0  UTI's in the last year.   Reports history of kidney or bladder stones, pyelonephritis, bladder cancer, and kidney cancer Reports intermittent blood in urine for 10 years No results found for the last 90 days.   Pelvic Organ Prolapse Symptoms:                  Patient Denies a feeling of a bulge the vaginal area.  Reports cherry tomato size bulge in the vagina Patient Denies seeing a bulge.  This bulge is not bothersome.  Bowel Symptom: Bowel movements: 2-3 time(s) per day, Bristol III or VI-VII stool with intermittent blood in stool attributed to internal hemorrhoids Stool consistency: soft  Straining: no.  Splinting: no.  Incomplete evacuation: no.  Patient Denies accidental bowel leakage / fecal incontinence Bowel regimen: diet with increased fruit and yogurt with decreased diarrhea Last colonoscopy: Results 5 precancerous polyps per reports, repeat due 12/2023 [facility] Colonoscopy          Upcoming     Colonoscopy (Every 3 Years) Next due on 01/29/2024    01/28/2021  Done - Dr Tova Fresh, 4 polyps, 3 year follow up   Only the first 1 history entries have been loaded, but more history exists.                Sexual Function Sexually active: no.  Sexual orientation: Straight Pain with sex: Yes, at the vaginal opening, has discomfort due to dryness  Pelvic Pain Admits to pelvic pain started when she stopped walking around 1 month ago due to eye infection exacerbated by pollen Location: RLQ 6/10 Pain occurs: intermittently Prior pain treatment: none, improves when she walks  Improved by: walking Worsened by: when she stands after sitting and crossing her legs   Past Medical History:  Past Medical History:  Diagnosis Date    Colon polyps    GERD (gastroesophageal reflux disease)    Hyperlipidemia    In the past has been controlled with diet     Past Surgical History:   Past Surgical History:  Procedure Laterality Date   ABDOMINAL HYSTERECTOMY  1986   ovaries remain, done for bleeding   APPENDECTOMY  1986   done same day as hysterectomy   CHOLECYSTECTOMY     COLONOSCOPY       Past OB/GYN History: OB History  Gravida Para Term Preterm AB Living  2 2 2   2   SAB IAB Ectopic Multiple Live Births      2    # Outcome Date GA Lbr Len/2nd Weight Sex Type Anes PTL Lv  2 Term     M Vag-Spont   LIV  1 Term     M Vag-Spont   LIV    Vaginal deliveries: 2, largest infant 8lb7oz. Forceps/ Vacuum deliveries: 1 forceps, Cesarean section: 0 Menopausal: Yes, at age 81, Admits to vaginal bleeding since menopause Contraception: s/p hysterectomy at 68yo due to AUB, and menopause. Last pap smear.  Any history of abnormal pap smears: no.    Component Value Date/Time   DIAGPAP  02/12/2022 0823    - Negative for intraepithelial lesion or malignancy (NILM)   ADEQPAP  02/12/2022 0823    Satisfactory for evaluation. The presence  or absence of an   ADEQPAP  02/12/2022 0823    endocervical/transformation zone component cannot be determined because   ADEQPAP of atrophy. 02/12/2022 1610    Medications: Patient has a current medication list which includes the following prescription(s): benzonatate , [START ON 08/25/2023] estradiol, and pantoprazole.   Allergies: Patient is allergic to meclizine.   Social History:  Social History   Tobacco Use   Smoking status: Former    Current packs/day: 0.00    Types: Cigarettes    Quit date: 04/25/2010    Years since quitting: 13.3   Smokeless tobacco: Never  Vaping Use   Vaping status: Never Used  Substance Use Topics   Alcohol use: Never   Drug use: Never    Relationship status: widowed Patient lives alone.   Patient is not employed. Regular exercise: Yes:  weights History of abuse: No  Family History:   Family History  Problem Relation Age of Onset   Heart disease Mother        heart attack   Diabetes Mother    Hyperlipidemia Mother    Hypertension Mother    Breast cancer Sister        breast   Heart disease Sister        heart attack   Hyperlipidemia Sister    Hypertension Sister    Hyperlipidemia Brother    Hypertension Brother    Colon cancer Maternal Grandmother      Review of Systems: Review of Systems  Constitutional:  Negative for fever, malaise/fatigue and weight loss.       Weight gain  Respiratory:  Negative for cough, shortness of breath and wheezing.   Cardiovascular:  Negative for chest pain, palpitations and leg swelling.  Gastrointestinal:  Positive for blood in stool. Negative for abdominal pain.  Genitourinary:  Positive for frequency. Negative for dysuria, hematuria and urgency.       Bulge, bleeding  Skin:  Negative for rash.  Neurological:  Negative for dizziness, weakness and headaches.  Endo/Heme/Allergies:  Does not bruise/bleed easily.       Hot flashes  Psychiatric/Behavioral:  Negative for depression. The patient is not nervous/anxious.      OBJECTIVE Physical Exam: Vitals:   08/22/23 1252  BP: (!) 148/87  Pulse: 80  Weight: 184 lb (83.5 kg)  Height: 5' 4.25" (1.632 m)    Physical Exam Constitutional:      General: She is not in acute distress.    Appearance: Normal appearance.  Genitourinary:     Bladder and urethral meatus normal.     No lesions in the vagina.     Right Labia: No rash, tenderness, lesions, skin changes or Bartholin's cyst.    Left Labia: No tenderness, lesions, skin changes, Bartholin's cyst or rash.    No vaginal discharge, erythema, tenderness, bleeding, ulceration or granulation tissue.     Posterior vaginal prolapse present.    Moderate vaginal atrophy present.     Right Adnexa: not tender, not full and no mass present.    Left Adnexa: not tender, not full  and no mass present.    Cervix is absent.     Uterus is absent.     Urethral meatus caruncle not present.    No urethral prolapse, tenderness, mass, hypermobility, discharge or stress urinary incontinence with cough stress test present.     Bladder is not tender, urgency on palpation not present and masses not present.      Pelvic Floor: Levator muscle strength is 3/5.  Levator ani not tender, obturator internus not tender, no asymmetrical contractions present and no pelvic spasms present.    Symmetrical pelvic sensation, anal wink present and BC reflex present. Cardiovascular:     Rate and Rhythm: Normal rate.  Pulmonary:     Effort: Pulmonary effort is normal. No respiratory distress.  Abdominal:     General: There is no distension.     Palpations: Abdomen is soft. There is no mass.     Tenderness: There is no abdominal tenderness.     Hernia: No hernia is present.    Neurological:     Mental Status: She is alert.  Vitals reviewed. Exam conducted with a chaperone present.      POP-Q:   POP-Q  -2                                            Aa   -2                                           Ba  -6                                              C   2                                            Gh  4                                            Pb  7                                            tvl   -1                                            Ap  -1                                            Bp  -6                                              D    Post-Void Residual (PVR) by Bladder Scan: In order to evaluate bladder emptying, we discussed obtaining a postvoid residual and patient agreed to this procedure.  Procedure: The ultrasound unit was placed on the patient's abdomen in the suprapubic region after the patient had voided.    Post Void Residual -  08/22/23 1329       Post Void Residual   Post Void Residual 66 mL            Straight Catheterization  Procedure for abnormal UA: After verbal consent was obtained from the patient for catheterization to assess bladder emptying and residual volume the urethra and surrounding tissues were visualized and an in and out catheterization was performed.  PVR was .  Urine appeared clear yellow. The patient tolerated the procedure well.  Laboratory Results: Lab Results  Component Value Date   COLORU Yellow 08/22/2023   CLARITYU Clear 08/22/2023   GLUCOSEUR Negative 08/22/2023   BILIRUBINUR Negative 08/22/2023   KETONESU Negative 08/22/2023   SPECGRAV <=1.005 (A) 08/22/2023   RBCUR Moderate 08/22/2023   PHUR 6.0 08/22/2023   PROTEINUR Negative 08/22/2023   UROBILINOGEN 0.2 08/22/2023   LEUKOCYTESUR Negative 08/22/2023    Lab Results  Component Value Date   CREATININE 0.82 10/07/2022   CREATININE 0.85 11/06/2008    No results found for: "HGBA1C"  Lab Results  Component Value Date   HGB 13.0 10/07/2022     ASSESSMENT AND PLAN Ms. Zappia is a 68 y.o. with:  1. Hematuria, unspecified type   2. Vaginal atrophy   3. Pelvic organ prolapse quantification stage 2 rectocele   4. Vaginal bleeding     Hematuria, unspecified type Assessment & Plan: - reports intermittent vaginal bleeding vs. Gross hematuria since 10 years ago - denies urology workup with known pelvic organ prolapse, symptoms worsen after straining - UA + heme, pending catheterized UA with microscopy.  - gross hematuria with prior tobacco use - For management of gross hematuria, we discussed the importance of work-up including assessing the upper and lower GU tract with CT urogram and cystoscopy. Last creatinine 0.83 on 03/23/23. She will pursue this work-up and follow-up afterward to discuss the results and decide on a treatment plan based on the findings.  - CT urogram ordered and pt to schedule - office to call and schedule cystoscopy after PA  Orders: -     POCT urinalysis dipstick -     CT ABDOMEN PELVIS W WO  CONTRAST; Future -     Urine Microscopic; Future -     Estradiol; Place 0.5g nightly for two weeks then twice a week after  Dispense: 30 g; Refill: 3  Vaginal atrophy Assessment & Plan: - For symptomatic vaginal atrophy options include lubrication with a water-based lubricant, personal hygiene measures and barrier protection against wetness, and estrogen replacement in the form of vaginal cream, vaginal tablets, or a time-released vaginal ring.   - reports prior hesitation due to sister's breast cancer history, reassured pt  - We discussed the potential risks associated with hormone replacement including stroke, heart attack, and blood clots; and the fact that these risks are very low with vaginal estrogen use due to the very low systemic absorption rate of ~ 0.01% with a twice-week regimen. - Rx to start low dose vaginal estrogen 0.5-1g twice a week - vaginal moisturizers provided for use as needed due to dryness  Orders: -     POCT urinalysis dipstick -     Estradiol; Place 0.5g nightly for two weeks then twice a week after  Dispense: 30 g; Refill: 3  Pelvic organ prolapse quantification stage 2 rectocele Assessment & Plan: - prior evaluation by Dr. Camilo Cella (urogyn) at [facility] in 2021 for grade I-II rectocele and encouraged fiber supplementation and avoid constipation - reports increased vaginal bleeding after straining  intermittently for bowel movements with history of internal hemorrhoids. Encouraged fiber supplementation to optimize stool consistency and squatting position for defecation to reduce straining - For treatment of pelvic organ prolapse, we discussed options for management including expectant management, conservative management, and surgical management, such as Kegels, a pessary, pelvic floor physical therapy, and specific surgical procedures. - start low dose vaginal estrogen   Vaginal bleeding Assessment & Plan: - vaginal vs. Urinary in origin - no vaginal bleeding with  atrophy noted on exam - no masses noted from speculum or bimanual exam - TVUS 03/28/23 with no adnexal masses or abnormal findings noted - start low dose vaginal estrogen - pending hematuria workup   Time spent: I spent 65 minutes dedicated to the care of this patient on the date of this encounter to include pre-visit review of records, face-to-face time with the patient discussing vaginal bleeding vs. Gross hematuria, stage II pelvic organ prolapse, vaginal atrophy, and post visit documentation and ordering medication/ testing.    Darlene Ehlers, MD

## 2023-08-22 NOTE — Assessment & Plan Note (Addendum)
-   vaginal vs. Urinary in origin - no vaginal bleeding with atrophy noted on exam - no masses noted from speculum or bimanual exam - TVUS 03/28/23 with no adnexal masses or abnormal findings noted - start low dose vaginal estrogen - pending hematuria workup, catheterized sample to minimize contamination in urine sample

## 2023-08-22 NOTE — Patient Instructions (Addendum)
 For management of microscopic hematuria, we discussed the importance of work-up including assessing the upper and lower urinary tract with CT urogram and cystoscopy.   We will call you to schedule cystoscopy.   Please call radiology at [phone] to schedule your imaging study today  For vaginal atrophy (thinning of the vaginal tissue that can cause dryness and burning) and UTI prevention we discussed estrogen replacement in the form of vaginal cream.   Start vaginal estrogen therapy nightly for two weeks then 2 times weekly at night. This can be placed with your finger or an applicator inside the vagina and around the urethra.  Please let us  know if the prescription is too expensive and we can look for alternative options.   Is vaginal estrogen therapy safe for me? Vaginal estrogen preparations act on the vaginal skin, and only a very tiny amount is absorbed into the bloodstream (0.01%).  They work in a similar way to hand or face cream.  There is minimal absorption and they are therefore perfectly safe. If you have had breast cancer and have persistent troublesome symptoms which aren't settling with vaginal moisturisers and lubricants, local estrogen treatment may be a possibility, but consultation with your oncologist should take place first.   We discussed the symptoms of overactive bladder (OAB), which include urinary urgency, urinary frequency, night-time urination, with or without urge incontinence.  We discussed management including behavioral therapy (decreasing bladder irritants by following a bladder diet, urge suppression strategies, timed voids, bladder retraining), physical therapy, medication; and for refractory cases posterior tibial nerve stimulation, sacral neuromodulation, and intravesical botulinum toxin injection.   Reduce your fluid intake  Constipation: Our goal is to achieve formed bowel movements daily or every-other-day.  You may need to try different combinations of  the following options to find what works best for you - everybody's body works differently so feel free to adjust the dosages as needed.  Some options to help maintain bowel health include:  Dietary changes (more leafy greens, vegetables and fruits; less processed foods) Fiber supplementation (Benefiber, FiberCon, Metamucil or Psyllium). Start slow and increase gradually to full dose. Over-the-counter agents such as: stool softeners (Docusate or Colace) and/or laxatives (Miralax, milk of magnesia)  "Power Pudding" is a natural mixture that may help your constipation.  To make blend 1 cup applesauce, 1 cup wheat bran, and 3/4 cup prune juice, refrigerate and then take 1 tablespoon daily with a large glass of water as needed.   Women should try to eat at least 21 to 25 grams of fiber a day, while men should aim for 30 to 38 grams a day. You can add fiber to your diet with food or a fiber supplement such as psyllium (metamucil), benefiber, or fibercon.   Here's a look at how much dietary fiber is found in some common foods. When buying packaged foods, check the Nutrition Facts label for fiber content. It can vary among brands.  Fruits Serving size Total fiber (grams)*  Raspberries 1 cup 8.0  Pear 1 medium 5.5  Apple, with skin 1 medium 4.5  Banana 1 medium 3.0  Orange 1 medium 3.0  Strawberries 1 cup 3.0   Vegetables Serving size Total fiber (grams)*  Green peas, boiled 1 cup 9.0  Broccoli, boiled 1 cup chopped 5.0  Turnip greens, boiled 1 cup 5.0  Brussels sprouts, boiled 1 cup 4.0  Potato, with skin, baked 1 medium 4.0  Sweet corn, boiled 1 cup 3.5  Cauliflower, raw 1 cup  chopped 2.0  Carrot, raw 1 medium 1.5   Grains Serving size Total fiber (grams)*  Spaghetti, whole-wheat, cooked 1 cup 6.0  Barley, pearled, cooked 1 cup 6.0  Bran flakes 3/4 cup 5.5  Quinoa, cooked 1 cup 5.0  Oat bran muffin 1 medium 5.0  Oatmeal, instant, cooked 1 cup 5.0  Popcorn, air-popped 3 cups 3.5  Brown  rice, cooked 1 cup 3.5  Bread, whole-wheat 1 slice 2.0  Bread, rye 1 slice 2.0   Legumes, nuts and seeds Serving size Total fiber (grams)*  Split peas, boiled 1 cup 16.0  Lentils, boiled 1 cup 15.5  Black beans, boiled 1 cup 15.0  Baked beans, canned 1 cup 10.0  Chia seeds 1 ounce 10.0  Almonds 1 ounce (23 nuts) 3.5  Pistachios 1 ounce (49 nuts) 3.0  Sunflower kernels 1 ounce 3.0  *Rounded to nearest 0.5 gram. Source: [employer] for [employer] have a stage 2 (out of 4) prolapse.  We discussed the fact that it is not life threatening but there are several treatment options. For treatment of pelvic organ prolapse, we discussed options for management including expectant management, conservative management, and surgical management, such as Kegels, a pessary, pelvic floor physical therapy, and specific surgical procedures.

## 2023-08-22 NOTE — Assessment & Plan Note (Addendum)
-   For symptomatic vaginal atrophy options include lubrication with a water-based lubricant, personal hygiene measures and barrier protection against wetness, and estrogen replacement in the form of vaginal cream, vaginal tablets, or a time-released vaginal ring.   - reports prior hesitation due to sister's breast cancer history, reassured pt  - We discussed the potential risks associated with hormone replacement including stroke, heart attack, and blood clots; and the fact that these risks are very low with vaginal estrogen use due to the very low systemic absorption rate of ~ 0.01% with a twice-week regimen. - Rx to start low dose vaginal estrogen 0.5-1g twice a week - vaginal moisturizers provided for use as needed due to dryness

## 2023-08-22 NOTE — Assessment & Plan Note (Signed)
-   reports intermittent vaginal bleeding vs. Gross hematuria since 10 years ago - denies urology workup with known pelvic organ prolapse, symptoms worsen after straining - UA + heme, pending catheterized UA with microscopy.  - gross hematuria with prior tobacco use - For management of gross hematuria, we discussed the importance of work-up including assessing the upper and lower GU tract with CT urogram and cystoscopy. Last creatinine 0.83 on 03/23/23. She will pursue this work-up and follow-up afterward to discuss the results and decide on a treatment plan based on the findings.  - CT urogram ordered and pt to schedule - office to call and schedule cystoscopy after PA

## 2023-08-22 NOTE — Assessment & Plan Note (Addendum)
-   prior evaluation by Dr. Camilo Cella (urogyn) at [facility] in 2021 for grade I-II rectocele and encouraged fiber supplementation and avoid constipation - reports increased vaginal bleeding after straining intermittently for bowel movements with history of internal hemorrhoids. Encouraged fiber supplementation to optimize stool consistency and squatting position for defecation to reduce straining - For treatment of pelvic organ prolapse, we discussed options for management including expectant management, conservative management, and surgical management, such as Kegels, a pessary, pelvic floor physical therapy, and specific surgical procedures. - start low dose vaginal estrogen

## 2023-08-23 ENCOUNTER — Telehealth: Payer: Self-pay

## 2023-08-23 NOTE — Telephone Encounter (Signed)
 Patient called and left a voice mail this morning in regards to the estrace cream. She said she was told to call if her Rx was over $20 and for her to pick up it would be $34, with [facility] it would be $28. It is only $14 with [employer]. After calling her back we discussed the [employer]. She doesn't do anything electronic or on the internet, so she is ok to pick up at [facility].

## 2023-08-29 ENCOUNTER — Ambulatory Visit (HOSPITAL_COMMUNITY)
Admission: RE | Admit: 2023-08-29 | Discharge: 2023-08-29 | Disposition: A | Discharge status: Home / Self Care | Source: Ambulatory Visit | Attending: Obstetrics | Admitting: Obstetrics

## 2023-08-29 DIAGNOSIS — R319 Hematuria, unspecified: Secondary | ICD-10-CM | POA: Diagnosis present

## 2023-08-29 MED ORDER — IOHEXOL 350 MG/ML SOLN
100.0000 mL | Freq: Once | INTRAVENOUS | Status: AC | PRN
  Administered 2023-08-29: 100 mL via INTRAVENOUS

## 2023-09-05 ENCOUNTER — Telehealth: Payer: Self-pay

## 2023-09-05 NOTE — Telephone Encounter (Signed)
 Lindsey Morales called the requesting the results to the CT done on 08/29/2023. Please advise.

## 2023-09-07 ENCOUNTER — Ambulatory Visit: Payer: Self-pay | Admitting: Obstetrics

## 2023-10-20 ENCOUNTER — Other Ambulatory Visit: Admitting: Obstetrics

## 2023-11-15 ENCOUNTER — Other Ambulatory Visit (HOSPITAL_BASED_OUTPATIENT_CLINIC_OR_DEPARTMENT_OTHER): Payer: Self-pay | Admitting: Family Medicine

## 2023-11-15 DIAGNOSIS — Z1231 Encounter for screening mammogram for malignant neoplasm of breast: Secondary | ICD-10-CM

## 2024-02-13 ENCOUNTER — Ambulatory Visit (HOSPITAL_BASED_OUTPATIENT_CLINIC_OR_DEPARTMENT_OTHER): Admitting: Radiology
